# Patient Record
Sex: Male | Born: 1965 | Race: Black or African American | Hispanic: No | Marital: Single
Health system: Southern US, Community
[De-identification: ages and names within clinical notes are randomized; demographics above are authoritative.]

---

## 2001-08-26 ENCOUNTER — Encounter: Payer: Self-pay | Admitting: Internal Medicine

## 2001-08-26 ENCOUNTER — Ambulatory Visit (HOSPITAL_COMMUNITY): Admission: RE | Admit: 2001-08-26 | Discharge: 2001-08-26 | Payer: Self-pay | Admitting: Internal Medicine

## 2001-09-06 ENCOUNTER — Ambulatory Visit (HOSPITAL_COMMUNITY): Admission: RE | Admit: 2001-09-06 | Discharge: 2001-09-06 | Payer: Self-pay | Admitting: Internal Medicine

## 2001-09-06 ENCOUNTER — Encounter: Payer: Self-pay | Admitting: Internal Medicine

## 2004-03-10 ENCOUNTER — Emergency Department: Payer: Self-pay | Admitting: Emergency Medicine

## 2004-03-10 ENCOUNTER — Other Ambulatory Visit: Payer: Self-pay

## 2005-02-10 ENCOUNTER — Other Ambulatory Visit: Payer: Self-pay

## 2005-02-10 ENCOUNTER — Emergency Department: Payer: Self-pay | Admitting: Emergency Medicine

## 2005-04-03 ENCOUNTER — Emergency Department: Payer: Self-pay | Admitting: Emergency Medicine

## 2006-04-22 ENCOUNTER — Emergency Department: Payer: Self-pay | Admitting: General Practice

## 2006-06-09 ENCOUNTER — Emergency Department: Payer: Self-pay | Admitting: Emergency Medicine

## 2006-07-03 ENCOUNTER — Emergency Department: Payer: Self-pay | Admitting: Emergency Medicine

## 2007-07-22 ENCOUNTER — Emergency Department: Payer: Self-pay | Admitting: Emergency Medicine

## 2007-07-22 ENCOUNTER — Other Ambulatory Visit: Payer: Self-pay

## 2007-12-05 ENCOUNTER — Emergency Department: Payer: Self-pay | Admitting: Emergency Medicine

## 2008-08-02 ENCOUNTER — Emergency Department: Payer: Self-pay | Admitting: Emergency Medicine

## 2009-02-06 ENCOUNTER — Emergency Department: Payer: Self-pay | Admitting: Emergency Medicine

## 2009-02-08 ENCOUNTER — Emergency Department: Payer: Self-pay | Admitting: Emergency Medicine

## 2009-02-27 ENCOUNTER — Emergency Department: Payer: Self-pay | Admitting: Emergency Medicine

## 2009-05-06 ENCOUNTER — Emergency Department: Payer: Self-pay | Admitting: Emergency Medicine

## 2009-07-31 ENCOUNTER — Emergency Department: Payer: Self-pay | Admitting: Emergency Medicine

## 2009-08-20 ENCOUNTER — Emergency Department: Payer: Self-pay | Admitting: Emergency Medicine

## 2009-08-23 ENCOUNTER — Emergency Department: Payer: Self-pay | Admitting: Emergency Medicine

## 2009-11-01 ENCOUNTER — Emergency Department: Payer: Self-pay | Admitting: Emergency Medicine

## 2010-05-04 ENCOUNTER — Emergency Department: Payer: Self-pay | Admitting: Emergency Medicine

## 2010-06-06 ENCOUNTER — Emergency Department: Payer: Self-pay | Admitting: Emergency Medicine

## 2010-07-13 ENCOUNTER — Emergency Department: Payer: Self-pay | Admitting: Emergency Medicine

## 2011-01-15 ENCOUNTER — Emergency Department: Payer: Self-pay | Admitting: Emergency Medicine

## 2011-02-07 ENCOUNTER — Emergency Department: Payer: Self-pay | Admitting: Emergency Medicine

## 2011-02-28 ENCOUNTER — Emergency Department: Payer: Self-pay | Admitting: Emergency Medicine

## 2011-04-25 ENCOUNTER — Emergency Department: Payer: Self-pay | Admitting: Emergency Medicine

## 2011-04-25 LAB — URINALYSIS, COMPLETE
Bacteria: NONE SEEN
Blood: NEGATIVE
Glucose,UR: NEGATIVE mg/dL (ref 0–75)
Ketone: NEGATIVE
Nitrite: NEGATIVE
Specific Gravity: 1.024 (ref 1.003–1.030)
Squamous Epithelial: NONE SEEN
WBC UR: <1 /HPF (ref 0–5)

## 2011-04-25 LAB — CBC
HCT: 42.1 % (ref 40.0–52.0)
HGB: 14.3 g/dL (ref 13.0–18.0)
MCH: 30.5 pg (ref 26.0–34.0)
MCHC: 34 g/dL (ref 32.0–36.0)
MCV: 90 fL (ref 80–100)
Platelet: 231 10*3/uL (ref 150–440)
RBC: 4.7 10*6/uL (ref 4.40–5.90)
RDW: 13.2 % (ref 11.5–14.5)

## 2011-04-25 LAB — COMPREHENSIVE METABOLIC PANEL
Albumin: 4.2 g/dL (ref 3.4–5.0)
BUN: 16 mg/dL (ref 7–18)
Bilirubin,Total: 0.3 mg/dL (ref 0.2–1.0)
Chloride: 108 mmol/L — ABNORMAL HIGH (ref 98–107)
EGFR (African American): >60
EGFR (Non-African Amer.): >60
Glucose: 115 mg/dL — ABNORMAL HIGH (ref 65–99)
Osmolality: 289 (ref 275–301)
Potassium: 3.9 mmol/L (ref 3.5–5.1)
SGOT(AST): 29 U/L (ref 15–37)
Sodium: 144 mmol/L (ref 136–145)

## 2011-04-25 LAB — LIPASE, BLOOD: Lipase: 243 U/L (ref 73–393)

## 2011-05-07 ENCOUNTER — Ambulatory Visit: Payer: Self-pay | Admitting: Physician Assistant

## 2011-05-12 ENCOUNTER — Emergency Department: Payer: Self-pay | Admitting: Emergency Medicine

## 2011-05-12 LAB — URINALYSIS, COMPLETE
Bacteria: NONE SEEN
Bilirubin,UR: NEGATIVE
Blood: NEGATIVE
Glucose,UR: NEGATIVE mg/dL (ref 0–75)
Ketone: NEGATIVE
Leukocyte Esterase: NEGATIVE
Nitrite: NEGATIVE
Ph: 6 (ref 4.5–8.0)
RBC,UR: <1 /HPF (ref 0–5)
Squamous Epithelial: <1

## 2011-05-12 LAB — CBC
MCHC: 34.6 g/dL (ref 32.0–36.0)
Platelet: 215 10*3/uL (ref 150–440)
RDW: 13 % (ref 11.5–14.5)
WBC: 6.8 10*3/uL (ref 3.8–10.6)

## 2011-05-12 LAB — COMPREHENSIVE METABOLIC PANEL
Bilirubin,Total: 0.4 mg/dL (ref 0.2–1.0)
Calcium, Total: 9.3 mg/dL (ref 8.5–10.1)
Chloride: 104 mmol/L (ref 98–107)
Co2: 31 mmol/L (ref 21–32)
Creatinine: 1.18 mg/dL (ref 0.60–1.30)
EGFR (African American): >60
EGFR (Non-African Amer.): >60
Osmolality: 285 (ref 275–301)
SGOT(AST): 36 U/L (ref 15–37)
SGPT (ALT): 46 U/L

## 2011-05-21 ENCOUNTER — Emergency Department: Payer: Self-pay | Admitting: *Deleted

## 2011-05-22 ENCOUNTER — Other Ambulatory Visit: Payer: Self-pay | Admitting: Gastroenterology

## 2011-05-26 ENCOUNTER — Other Ambulatory Visit: Payer: Self-pay

## 2011-06-29 ENCOUNTER — Emergency Department: Payer: Self-pay | Admitting: Emergency Medicine

## 2011-06-29 LAB — CBC
HCT: 31.5 % — ABNORMAL LOW (ref 40.0–52.0)
HGB: 10.5 g/dL — ABNORMAL LOW (ref 13.0–18.0)
MCH: 29.7 pg (ref 26.0–34.0)
MCHC: 33.5 g/dL (ref 32.0–36.0)
MCV: 89 fL (ref 80–100)
Platelet: 561 10*3/uL — ABNORMAL HIGH (ref 150–440)
RBC: 3.55 10*6/uL — ABNORMAL LOW (ref 4.40–5.90)
RDW: 13.7 % (ref 11.5–14.5)
WBC: 10.3 10*3/uL (ref 3.8–10.6)

## 2011-06-29 LAB — COMPREHENSIVE METABOLIC PANEL
Albumin: 3.1 g/dL — ABNORMAL LOW (ref 3.4–5.0)
BUN: 17 mg/dL (ref 7–18)
Bilirubin,Total: 0.6 mg/dL (ref 0.2–1.0)
Chloride: 97 mmol/L — ABNORMAL LOW (ref 98–107)
Co2: 28 mmol/L (ref 21–32)
Creatinine: 0.97 mg/dL (ref 0.60–1.30)
EGFR (African American): >60
EGFR (Non-African Amer.): >60
Osmolality: 275 (ref 275–301)
Potassium: 3.6 mmol/L (ref 3.5–5.1)
SGPT (ALT): 43 U/L
Sodium: 136 mmol/L (ref 136–145)
Total Protein: 8.1 g/dL (ref 6.4–8.2)

## 2011-06-29 LAB — TSH: Thyroid Stimulating Horm: 1.65 u[IU]/mL

## 2011-06-30 LAB — HEMATOCRIT: HCT: 27.9 % — ABNORMAL LOW (ref 40.0–52.0)

## 2011-06-30 LAB — PROTIME-INR
INR: 1.1
Prothrombin Time: 14.7 secs (ref 11.5–14.7)

## 2011-06-30 LAB — APTT: Activated PTT: 27 secs (ref 23.6–35.9)

## 2011-07-05 LAB — CULTURE, BLOOD (SINGLE)

## 2011-07-28 ENCOUNTER — Emergency Department: Payer: Self-pay | Admitting: Emergency Medicine

## 2011-08-07 ENCOUNTER — Ambulatory Visit: Payer: Self-pay | Admitting: Internal Medicine

## 2011-08-07 LAB — CBC CANCER CENTER
Basophil %: 0.8 %
Eosinophil #: 0.2 x10 3/mm (ref 0.0–0.7)
Eosinophil %: 7 %
HGB: 11.2 g/dL — ABNORMAL LOW (ref 13.0–18.0)
Lymphocyte #: 0.7 x10 3/mm — ABNORMAL LOW (ref 1.0–3.6)
Lymphocyte %: 30.9 %
MCH: 28.1 pg (ref 26.0–34.0)
MCHC: 31.2 g/dL — ABNORMAL LOW (ref 32.0–36.0)
Monocyte #: 0.3 x10 3/mm (ref 0.2–1.0)
Monocyte %: 13.2 %
Neutrophil #: 1.2 x10 3/mm — ABNORMAL LOW (ref 1.4–6.5)
Platelet: 319 x10 3/mm (ref 150–440)
RBC: 3.98 10*6/uL — ABNORMAL LOW (ref 4.40–5.90)
RDW: 16.7 % — ABNORMAL HIGH (ref 11.5–14.5)

## 2011-08-07 LAB — COMPREHENSIVE METABOLIC PANEL
Albumin: 3.6 g/dL (ref 3.4–5.0)
Anion Gap: 6 — ABNORMAL LOW (ref 7–16)
BUN: 7 mg/dL (ref 7–18)
Calcium, Total: 9.3 mg/dL (ref 8.5–10.1)
Creatinine: 0.6 mg/dL (ref 0.60–1.30)
Glucose: 79 mg/dL (ref 65–99)
SGOT(AST): 33 U/L (ref 15–37)
SGPT (ALT): 32 U/L
Sodium: 144 mmol/L (ref 136–145)
Total Protein: 7.4 g/dL (ref 6.4–8.2)

## 2011-08-09 LAB — CANCER ANTIGEN 19-9: CA 19-9: 68 U/mL — ABNORMAL HIGH (ref 0–35)

## 2011-08-11 LAB — CREATININE, SERUM
Creatinine: 0.71 mg/dL (ref 0.60–1.30)
EGFR (African American): >60

## 2011-08-11 LAB — CBC CANCER CENTER
Basophil #: 0 x10 3/mm (ref 0.0–0.1)
Basophil %: 0.9 %
Eosinophil #: 0.1 x10 3/mm (ref 0.0–0.7)
Lymphocyte #: 0.8 x10 3/mm — ABNORMAL LOW (ref 1.0–3.6)
MCHC: 31.8 g/dL — ABNORMAL LOW (ref 32.0–36.0)
Monocyte #: 0.3 x10 3/mm (ref 0.2–1.0)
Monocyte %: 11.1 %
Neutrophil #: 1.8 x10 3/mm (ref 1.4–6.5)
Neutrophil %: 59.8 %
Platelet: 312 x10 3/mm (ref 150–440)
RDW: 16.3 % — ABNORMAL HIGH (ref 11.5–14.5)
WBC: 3 x10 3/mm — ABNORMAL LOW (ref 3.8–10.6)

## 2011-08-11 LAB — PROTIME-INR
INR: 1
Prothrombin Time: 13.9 secs (ref 11.5–14.7)

## 2011-08-11 LAB — LACTATE DEHYDROGENASE: LDH: 144 U/L (ref 87–241)

## 2011-08-11 LAB — RETICULOCYTES: Absolute Retic Count: 0.0335 10*6/uL (ref 0.024–0.084)

## 2011-08-11 LAB — IRON AND TIBC
Iron Bind.Cap.(Total): 257 ug/dL (ref 250–450)
Unbound Iron-Bind.Cap.: 228 ug/dL

## 2011-08-11 LAB — FERRITIN: Ferritin (ARMC): 233 ng/mL (ref 8–388)

## 2011-08-12 LAB — CBC CANCER CENTER
Basophil %: 1.3 %
Eosinophil #: 0.1 x10 3/mm (ref 0.0–0.7)
HCT: 35.7 % — ABNORMAL LOW (ref 40.0–52.0)
HGB: 11.4 g/dL — ABNORMAL LOW (ref 13.0–18.0)
Lymphocyte #: 0.8 x10 3/mm — ABNORMAL LOW (ref 1.0–3.6)
MCHC: 31.9 g/dL — ABNORMAL LOW (ref 32.0–36.0)
Neutrophil #: 1.2 x10 3/mm — ABNORMAL LOW (ref 1.4–6.5)
Neutrophil %: 49.6 %
RBC: 4 10*6/uL — ABNORMAL LOW (ref 4.40–5.90)
RDW: 16.3 % — ABNORMAL HIGH (ref 11.5–14.5)
WBC: 2.5 x10 3/mm — ABNORMAL LOW (ref 3.8–10.6)

## 2011-08-12 LAB — CREATININE, SERUM
EGFR (African American): >60
EGFR (Non-African Amer.): >60

## 2011-08-12 LAB — PROT IMMUNOELECTROPHORES(ARMC)

## 2011-08-13 ENCOUNTER — Emergency Department: Payer: Self-pay | Admitting: Emergency Medicine

## 2011-08-13 LAB — COMPREHENSIVE METABOLIC PANEL
Albumin: 3.6 g/dL (ref 3.4–5.0)
Alkaline Phosphatase: 89 U/L (ref 50–136)
Anion Gap: 8 (ref 7–16)
BUN: 8 mg/dL (ref 7–18)
Bilirubin,Total: 0.2 mg/dL (ref 0.2–1.0)
Calcium, Total: 9 mg/dL (ref 8.5–10.1)
Chloride: 109 mmol/L — ABNORMAL HIGH (ref 98–107)
Co2: 27 mmol/L (ref 21–32)
EGFR (African American): >60
EGFR (Non-African Amer.): >60
Osmolality: 284 (ref 275–301)
Potassium: 3.5 mmol/L (ref 3.5–5.1)
SGOT(AST): 23 U/L (ref 15–37)
SGPT (ALT): 27 U/L
Sodium: 144 mmol/L (ref 136–145)

## 2011-08-13 LAB — CBC
HCT: 34.7 % — ABNORMAL LOW (ref 40.0–52.0)
MCV: 90 fL (ref 80–100)
Platelet: 298 10*3/uL (ref 150–440)
RBC: 3.84 10*6/uL — ABNORMAL LOW (ref 4.40–5.90)
RDW: 16.4 % — ABNORMAL HIGH (ref 11.5–14.5)
WBC: 2.9 10*3/uL — ABNORMAL LOW (ref 3.8–10.6)

## 2011-08-13 LAB — TROPONIN I: Troponin-I: <0.02 ng/mL

## 2011-08-14 LAB — URINALYSIS, COMPLETE
Bilirubin,UR: NEGATIVE
Blood: NEGATIVE
Glucose,UR: NEGATIVE mg/dL (ref 0–75)
Nitrite: NEGATIVE
Ph: 6 (ref 4.5–8.0)
RBC,UR: NONE SEEN /HPF (ref 0–5)
Squamous Epithelial: NONE SEEN

## 2011-08-14 LAB — CBC CANCER CENTER
Bands: 1 %
Basophil #: 0 x10 3/mm (ref 0.0–0.1)
Basophil %: 1 %
Eosinophil %: 4.1 %
Eosinophil: 4 %
HCT: 36.5 % — ABNORMAL LOW (ref 40.0–52.0)
Lymphocytes: 25 %
MCH: 28.1 pg (ref 26.0–34.0)
MCHC: 31.1 g/dL — ABNORMAL LOW (ref 32.0–36.0)
MCV: 90 fL (ref 80–100)
Monocyte #: 0.4 x10 3/mm (ref 0.2–1.0)
Monocyte %: 12.6 %
Neutrophil #: 1.7 x10 3/mm (ref 1.4–6.5)
RBC: 4.04 10*6/uL — ABNORMAL LOW (ref 4.40–5.90)
RDW: 16.4 % — ABNORMAL HIGH (ref 11.5–14.5)
Variant Lymphocyte: 1 %

## 2011-08-14 LAB — RETICULOCYTES: Reticulocyte: 0.67 % (ref 0.5–1.5)

## 2011-08-19 ENCOUNTER — Ambulatory Visit: Payer: Self-pay | Admitting: Vascular Surgery

## 2011-08-19 ENCOUNTER — Ambulatory Visit: Payer: Self-pay | Admitting: Internal Medicine

## 2011-08-20 ENCOUNTER — Ambulatory Visit: Payer: Self-pay | Admitting: Internal Medicine

## 2011-08-26 LAB — CREATININE, SERUM
EGFR (African American): >60
EGFR (Non-African Amer.): >60

## 2011-08-26 LAB — CBC CANCER CENTER
Basophil %: 1.1 %
HCT: 35.7 % — ABNORMAL LOW (ref 40.0–52.0)
HGB: 11.1 g/dL — ABNORMAL LOW (ref 13.0–18.0)
Lymphocyte %: 40.8 %
Monocyte #: 0.4 x10 3/mm (ref 0.2–1.0)
Monocyte %: 16.4 %
Neutrophil #: 1 x10 3/mm — ABNORMAL LOW (ref 1.4–6.5)
Platelet: 285 x10 3/mm (ref 150–440)
RDW: 16.1 % — ABNORMAL HIGH (ref 11.5–14.5)
WBC: 2.6 x10 3/mm — ABNORMAL LOW (ref 3.8–10.6)

## 2011-09-02 LAB — BASIC METABOLIC PANEL
Calcium, Total: 8.5 mg/dL (ref 8.5–10.1)
EGFR (African American): >60
Glucose: 121 mg/dL — ABNORMAL HIGH (ref 65–99)
Osmolality: 283 (ref 275–301)
Sodium: 142 mmol/L (ref 136–145)

## 2011-09-02 LAB — CBC CANCER CENTER
Basophil %: 1 %
Eosinophil #: 0 x10 3/mm (ref 0.0–0.7)
HCT: 33.3 % — ABNORMAL LOW (ref 40.0–52.0)
HGB: 10.6 g/dL — ABNORMAL LOW (ref 13.0–18.0)
Lymphocyte #: 0.9 x10 3/mm — ABNORMAL LOW (ref 1.0–3.6)
Lymphocyte %: 62 %
MCH: 28.3 pg (ref 26.0–34.0)
MCHC: 31.8 g/dL — ABNORMAL LOW (ref 32.0–36.0)
MCV: 89 fL (ref 80–100)
Neutrophil #: 0.3 x10 3/mm — ABNORMAL LOW (ref 1.4–6.5)
Platelet: 224 x10 3/mm (ref 150–440)
RBC: 3.75 10*6/uL — ABNORMAL LOW (ref 4.40–5.90)

## 2011-09-09 LAB — CBC CANCER CENTER
Eosinophil #: 0.1 x10 3/mm (ref 0.0–0.7)
HCT: 35 % — ABNORMAL LOW (ref 40.0–52.0)
HGB: 11 g/dL — ABNORMAL LOW (ref 13.0–18.0)
Lymphocyte #: 1 x10 3/mm (ref 1.0–3.6)
Lymphocyte %: 39.5 %
MCHC: 31.5 g/dL — ABNORMAL LOW (ref 32.0–36.0)
MCV: 90 fL (ref 80–100)
Monocyte #: 0.4 x10 3/mm (ref 0.2–1.0)
Monocyte %: 14.8 %
Neutrophil #: 1.1 x10 3/mm — ABNORMAL LOW (ref 1.4–6.5)
Neutrophil %: 42 %
RDW: 16.5 % — ABNORMAL HIGH (ref 11.5–14.5)
WBC: 2.6 x10 3/mm — ABNORMAL LOW (ref 3.8–10.6)

## 2011-09-09 LAB — BASIC METABOLIC PANEL
Anion Gap: 8 (ref 7–16)
BUN: 9 mg/dL (ref 7–18)
Creatinine: 0.73 mg/dL (ref 0.60–1.30)
EGFR (Non-African Amer.): >60
Glucose: 86 mg/dL (ref 65–99)
Sodium: 143 mmol/L (ref 136–145)

## 2011-09-13 ENCOUNTER — Emergency Department: Payer: Self-pay | Admitting: *Deleted

## 2011-09-16 LAB — CBC CANCER CENTER
Basophil #: 0 x10 3/mm (ref 0.0–0.1)
Eosinophil #: 0 x10 3/mm (ref 0.0–0.7)
HGB: 11 g/dL — ABNORMAL LOW (ref 13.0–18.0)
Lymphocyte #: 1.6 x10 3/mm (ref 1.0–3.6)
Lymphocyte %: 20.9 %
MCH: 28.7 pg (ref 26.0–34.0)
MCHC: 31.5 g/dL — ABNORMAL LOW (ref 32.0–36.0)
Monocyte #: 1.1 x10 3/mm — ABNORMAL HIGH (ref 0.2–1.0)
Monocyte %: 13.3 %
Neutrophil #: 5.1 x10 3/mm (ref 1.4–6.5)
Platelet: 240 x10 3/mm (ref 150–440)
RBC: 3.83 10*6/uL — ABNORMAL LOW (ref 4.40–5.90)
RDW: 16.5 % — ABNORMAL HIGH (ref 11.5–14.5)
WBC: 7.9 x10 3/mm (ref 3.8–10.6)

## 2011-09-16 LAB — BASIC METABOLIC PANEL
BUN: 11 mg/dL (ref 7–18)
Chloride: 108 mmol/L — ABNORMAL HIGH (ref 98–107)
Co2: 29 mmol/L (ref 21–32)
Creatinine: 0.81 mg/dL (ref 0.60–1.30)
EGFR (Non-African Amer.): >60
Glucose: 97 mg/dL (ref 65–99)
Osmolality: 290 (ref 275–301)
Potassium: 3 mmol/L — ABNORMAL LOW (ref 3.5–5.1)
Sodium: 146 mmol/L — ABNORMAL HIGH (ref 136–145)

## 2011-09-19 ENCOUNTER — Ambulatory Visit: Payer: Self-pay | Admitting: Internal Medicine

## 2011-09-30 LAB — BASIC METABOLIC PANEL
Anion Gap: 7 (ref 7–16)
Chloride: 108 mmol/L — ABNORMAL HIGH (ref 98–107)
Co2: 29 mmol/L (ref 21–32)
Creatinine: 0.75 mg/dL (ref 0.60–1.30)
Glucose: 102 mg/dL — ABNORMAL HIGH (ref 65–99)
Osmolality: 286 (ref 275–301)
Potassium: 3.6 mmol/L (ref 3.5–5.1)
Sodium: 144 mmol/L (ref 136–145)

## 2011-09-30 LAB — CBC CANCER CENTER
Basophil #: 0 x10 3/mm (ref 0.0–0.1)
Basophil %: 0.9 %
Eosinophil #: 0 x10 3/mm (ref 0.0–0.7)
HGB: 11 g/dL — ABNORMAL LOW (ref 13.0–18.0)
Lymphocyte #: 1 x10 3/mm (ref 1.0–3.6)
MCH: 29 pg (ref 26.0–34.0)
Monocyte %: 18.8 %
Neutrophil #: 0.5 x10 3/mm — ABNORMAL LOW (ref 1.4–6.5)
Neutrophil %: 27.7 %
Platelet: 413 x10 3/mm (ref 150–440)

## 2011-09-30 LAB — HEPATIC FUNCTION PANEL A (ARMC)
Alkaline Phosphatase: 107 U/L (ref 50–136)
Bilirubin,Total: 0.2 mg/dL (ref 0.2–1.0)
SGOT(AST): 24 U/L (ref 15–37)

## 2011-10-07 LAB — CBC CANCER CENTER
Eosinophil %: 2.5 %
HCT: 36.8 % — ABNORMAL LOW (ref 40.0–52.0)
HGB: 11.7 g/dL — ABNORMAL LOW (ref 13.0–18.0)
Lymphocyte %: 25.4 %
MCH: 29.5 pg (ref 26.0–34.0)
MCHC: 31.9 g/dL — ABNORMAL LOW (ref 32.0–36.0)
MCV: 92 fL (ref 80–100)
Neutrophil %: 51.9 %
Platelet: 394 x10 3/mm (ref 150–440)
RBC: 3.99 10*6/uL — ABNORMAL LOW (ref 4.40–5.90)
RDW: 18 % — ABNORMAL HIGH (ref 11.5–14.5)

## 2011-10-14 LAB — BASIC METABOLIC PANEL
BUN: 15 mg/dL (ref 7–18)
Calcium, Total: 9.1 mg/dL (ref 8.5–10.1)
Creatinine: 0.73 mg/dL (ref 0.60–1.30)
Glucose: 76 mg/dL (ref 65–99)
Osmolality: 285 (ref 275–301)

## 2011-10-14 LAB — CBC CANCER CENTER
Basophil #: 0.1 x10 3/mm (ref 0.0–0.1)
Basophil %: 0.6 %
Eosinophil #: 0.1 x10 3/mm (ref 0.0–0.7)
Eosinophil %: 0.7 %
HGB: 11.9 g/dL — ABNORMAL LOW (ref 13.0–18.0)
Lymphocyte #: 1.8 x10 3/mm (ref 1.0–3.6)
Lymphocyte %: 17.1 %
MCH: 29.4 pg (ref 26.0–34.0)
MCHC: 32 g/dL (ref 32.0–36.0)
Monocyte #: 0.6 x10 3/mm (ref 0.2–1.0)
Monocyte %: 5.9 %
Neutrophil #: 7.8 x10 3/mm — ABNORMAL HIGH (ref 1.4–6.5)
Neutrophil %: 75.7 %
RBC: 4.04 10*6/uL — ABNORMAL LOW (ref 4.40–5.90)

## 2011-10-19 ENCOUNTER — Ambulatory Visit: Payer: Self-pay | Admitting: Internal Medicine

## 2011-10-21 LAB — CBC CANCER CENTER
Basophil #: 0.1 x10 3/mm (ref 0.0–0.1)
Basophil %: 1.3 %
Eosinophil #: 0 x10 3/mm (ref 0.0–0.7)
Eosinophil %: 0.8 %
HCT: 34.2 % — ABNORMAL LOW (ref 40.0–52.0)
HGB: 11 g/dL — ABNORMAL LOW (ref 13.0–18.0)
Lymphocyte #: 1.3 x10 3/mm (ref 1.0–3.6)
Lymphocyte %: 32.7 %
MCH: 29.6 pg (ref 26.0–34.0)
MCHC: 32.2 g/dL (ref 32.0–36.0)
MCV: 92 fL (ref 80–100)
Monocyte #: 0.4 x10 3/mm (ref 0.2–1.0)
Monocyte %: 10.9 %
Neutrophil #: 2.2 x10 3/mm (ref 1.4–6.5)
Neutrophil %: 54.3 %
Platelet: 148 x10 3/mm — ABNORMAL LOW (ref 150–440)
RBC: 3.71 10*6/uL — ABNORMAL LOW (ref 4.40–5.90)
RDW: 17.4 % — ABNORMAL HIGH (ref 11.5–14.5)
WBC: 4 x10 3/mm (ref 3.8–10.6)

## 2011-10-28 LAB — CBC CANCER CENTER
Basophil #: 0 x10 3/mm (ref 0.0–0.1)
Basophil %: 0.4 %
Eosinophil #: 0.1 x10 3/mm (ref 0.0–0.7)
Eosinophil %: 0.6 %
HCT: 35.4 % — ABNORMAL LOW (ref 40.0–52.0)
HGB: 11.2 g/dL — ABNORMAL LOW (ref 13.0–18.0)
Lymphocyte #: 1.7 x10 3/mm (ref 1.0–3.6)
Lymphocyte %: 18.1 %
MCH: 29.7 pg (ref 26.0–34.0)
MCHC: 31.8 g/dL — ABNORMAL LOW (ref 32.0–36.0)
MCV: 93 fL (ref 80–100)
Monocyte #: 0.8 x10 3/mm (ref 0.2–1.0)
Monocyte %: 8.8 %
Neutrophil #: 6.9 x10 3/mm — ABNORMAL HIGH (ref 1.4–6.5)
Neutrophil %: 72.1 %
Platelet: 191 x10 3/mm (ref 150–440)
RBC: 3.79 10*6/uL — ABNORMAL LOW (ref 4.40–5.90)
RDW: 18.2 % — ABNORMAL HIGH (ref 11.5–14.5)
WBC: 9.5 x10 3/mm (ref 3.8–10.6)

## 2011-10-28 LAB — CREATININE, SERUM
Creatinine: 0.91 mg/dL (ref 0.60–1.30)
EGFR (African American): >60
EGFR (Non-African Amer.): >60

## 2011-10-28 LAB — HEPATIC FUNCTION PANEL A (ARMC)
Albumin: 3.6 g/dL (ref 3.4–5.0)
Alkaline Phosphatase: 140 U/L — ABNORMAL HIGH (ref 50–136)
Bilirubin, Direct: 0 mg/dL (ref 0.00–0.20)
Bilirubin,Total: 0.1 mg/dL — ABNORMAL LOW (ref 0.2–1.0)
SGOT(AST): 24 U/L (ref 15–37)
SGPT (ALT): 35 U/L
Total Protein: 7.5 g/dL (ref 6.4–8.2)

## 2011-10-29 LAB — CANCER ANTIGEN 19-9: CA 19-9: 208 U/mL — ABNORMAL HIGH (ref 0–35)

## 2011-11-03 ENCOUNTER — Ambulatory Visit: Payer: Self-pay | Admitting: Internal Medicine

## 2011-11-04 LAB — CBC CANCER CENTER
Basophil #: 0 x10 3/mm (ref 0.0–0.1)
Eosinophil #: 0.1 x10 3/mm (ref 0.0–0.7)
Eosinophil %: 2.5 %
HCT: 35.2 % — ABNORMAL LOW (ref 40.0–52.0)
HGB: 11.4 g/dL — ABNORMAL LOW (ref 13.0–18.0)
MCH: 30.2 pg (ref 26.0–34.0)
MCV: 94 fL (ref 80–100)
Monocyte #: 0.4 x10 3/mm (ref 0.2–1.0)
Neutrophil #: 0.9 x10 3/mm — ABNORMAL LOW (ref 1.4–6.5)
Neutrophil %: 39.6 %
Platelet: 338 x10 3/mm (ref 150–440)
RBC: 3.77 10*6/uL — ABNORMAL LOW (ref 4.40–5.90)
RDW: 19.3 % — ABNORMAL HIGH (ref 11.5–14.5)

## 2011-11-18 LAB — CBC CANCER CENTER
Eosinophil #: 0.1 x10 3/mm (ref 0.0–0.7)
Eosinophil %: 3.1 %
HGB: 12.5 g/dL — ABNORMAL LOW (ref 13.0–18.0)
Lymphocyte #: 0.7 x10 3/mm — ABNORMAL LOW (ref 1.0–3.6)
MCH: 31.5 pg (ref 26.0–34.0)
MCHC: 33.3 g/dL (ref 32.0–36.0)
MCV: 95 fL (ref 80–100)
Monocyte #: 0.4 x10 3/mm (ref 0.2–1.0)
Neutrophil #: 1 x10 3/mm — ABNORMAL LOW (ref 1.4–6.5)
Platelet: 274 x10 3/mm (ref 150–440)
RBC: 3.98 10*6/uL — ABNORMAL LOW (ref 4.40–5.90)

## 2011-11-18 LAB — BASIC METABOLIC PANEL
Anion Gap: 7 (ref 7–16)
BUN: 9 mg/dL (ref 7–18)
Calcium, Total: 9 mg/dL (ref 8.5–10.1)
Creatinine: 0.84 mg/dL (ref 0.60–1.30)
EGFR (African American): >60
EGFR (Non-African Amer.): >60
Glucose: 82 mg/dL (ref 65–99)
Potassium: 3.7 mmol/L (ref 3.5–5.1)
Sodium: 143 mmol/L (ref 136–145)

## 2011-11-19 ENCOUNTER — Ambulatory Visit: Payer: Self-pay | Admitting: Internal Medicine

## 2011-11-23 LAB — CBC CANCER CENTER
Basophil #: 0.1 x10 3/mm (ref 0.0–0.1)
Eosinophil #: 0.1 x10 3/mm (ref 0.0–0.7)
HCT: 39.1 % — ABNORMAL LOW (ref 40.0–52.0)
Lymphocyte %: 20.9 %
MCHC: 33.8 g/dL (ref 32.0–36.0)
Monocyte #: 0.5 x10 3/mm (ref 0.2–1.0)
Monocyte %: 16.1 %
Neutrophil #: 1.6 x10 3/mm (ref 1.4–6.5)
Neutrophil %: 56.8 %
Platelet: 227 x10 3/mm (ref 150–440)
RDW: 17.2 % — ABNORMAL HIGH (ref 11.5–14.5)
WBC: 2.8 x10 3/mm — ABNORMAL LOW (ref 3.8–10.6)

## 2011-11-25 LAB — BASIC METABOLIC PANEL
Anion Gap: 8 (ref 7–16)
Chloride: 104 mmol/L (ref 98–107)
EGFR (African American): >60
EGFR (Non-African Amer.): >60
Glucose: 81 mg/dL (ref 65–99)
Osmolality: 280 (ref 275–301)

## 2011-11-30 LAB — CBC CANCER CENTER
Basophil #: 0.1 x10 3/mm (ref 0.0–0.1)
Eosinophil #: 0.4 x10 3/mm (ref 0.0–0.7)
HCT: 39.1 % — ABNORMAL LOW (ref 40.0–52.0)
Lymphocyte #: 0.4 x10 3/mm — ABNORMAL LOW (ref 1.0–3.6)
MCHC: 34 g/dL (ref 32.0–36.0)
MCV: 95 fL (ref 80–100)
Monocyte #: 0.3 x10 3/mm (ref 0.2–1.0)
Neutrophil #: 1.6 x10 3/mm (ref 1.4–6.5)
Platelet: 204 x10 3/mm (ref 150–440)
RBC: 4.13 10*6/uL — ABNORMAL LOW (ref 4.40–5.90)
RDW: 16.8 % — ABNORMAL HIGH (ref 11.5–14.5)
WBC: 2.8 x10 3/mm — ABNORMAL LOW (ref 3.8–10.6)

## 2011-12-02 LAB — BASIC METABOLIC PANEL
Anion Gap: 4 — ABNORMAL LOW (ref 7–16)
BUN: 12 mg/dL (ref 7–18)
Calcium, Total: 9.1 mg/dL (ref 8.5–10.1)
EGFR (African American): >60
EGFR (Non-African Amer.): >60
Glucose: 82 mg/dL (ref 65–99)
Osmolality: 280 (ref 275–301)
Potassium: 4 mmol/L (ref 3.5–5.1)

## 2011-12-02 LAB — CBC CANCER CENTER
Basophil #: 0 x10 3/mm (ref 0.0–0.1)
Eosinophil %: 15.4 %
HCT: 38.9 % — ABNORMAL LOW (ref 40.0–52.0)
Lymphocyte %: 13.4 %
Monocyte #: 0.3 x10 3/mm (ref 0.2–1.0)
Monocyte %: 11.8 %
Neutrophil %: 57.8 %
Platelet: 165 x10 3/mm (ref 150–440)
RDW: 16.9 % — ABNORMAL HIGH (ref 11.5–14.5)
WBC: 2.3 x10 3/mm — ABNORMAL LOW (ref 3.8–10.6)

## 2011-12-07 LAB — CBC CANCER CENTER
Basophil #: 0 x10 3/mm (ref 0.0–0.1)
Eosinophil %: 26 %
Lymphocyte #: 0.3 x10 3/mm — ABNORMAL LOW (ref 1.0–3.6)
Lymphocyte %: 13.1 %
MCH: 31.4 pg (ref 26.0–34.0)
MCV: 95 fL (ref 80–100)
Monocyte %: 14.4 %
Neutrophil %: 44.3 %
Platelet: 182 x10 3/mm (ref 150–440)
RBC: 3.98 10*6/uL — ABNORMAL LOW (ref 4.40–5.90)
RDW: 16.2 % — ABNORMAL HIGH (ref 11.5–14.5)
WBC: 2.2 x10 3/mm — ABNORMAL LOW (ref 3.8–10.6)

## 2011-12-09 LAB — HEPATIC FUNCTION PANEL A (ARMC)
Albumin: 3.7 g/dL (ref 3.4–5.0)
Alkaline Phosphatase: 86 U/L (ref 50–136)
Bilirubin, Direct: 0.1 mg/dL (ref 0.00–0.20)
Bilirubin,Total: 0.5 mg/dL (ref 0.2–1.0)
SGPT (ALT): 24 U/L (ref 12–78)

## 2011-12-09 LAB — CBC CANCER CENTER
Basophil #: 0.1 x10 3/mm (ref 0.0–0.1)
Eosinophil #: 0.3 x10 3/mm (ref 0.0–0.7)
Eosinophil %: 13.5 %
HCT: 37.7 % — ABNORMAL LOW (ref 40.0–52.0)
HGB: 12.4 g/dL — ABNORMAL LOW (ref 13.0–18.0)
Lymphocyte %: 11.6 %
MCHC: 32.9 g/dL (ref 32.0–36.0)
MCV: 95 fL (ref 80–100)
Neutrophil #: 1.1 x10 3/mm — ABNORMAL LOW (ref 1.4–6.5)
Neutrophil %: 59.7 %
Platelet: 192 x10 3/mm (ref 150–440)
RBC: 3.96 10*6/uL — ABNORMAL LOW (ref 4.40–5.90)
RDW: 16.1 % — ABNORMAL HIGH (ref 11.5–14.5)
WBC: 1.9 x10 3/mm — CL (ref 3.8–10.6)

## 2011-12-09 LAB — BASIC METABOLIC PANEL
Anion Gap: 7 (ref 7–16)
BUN: 10 mg/dL (ref 7–18)
Co2: 31 mmol/L (ref 21–32)
Creatinine: 0.83 mg/dL (ref 0.60–1.30)
EGFR (African American): >60
EGFR (Non-African Amer.): >60
Glucose: 102 mg/dL — ABNORMAL HIGH (ref 65–99)
Potassium: 3.5 mmol/L (ref 3.5–5.1)

## 2011-12-09 LAB — PROTIME-INR
INR: 0.9
Prothrombin Time: 12.8 secs (ref 11.5–14.7)

## 2011-12-09 LAB — MAGNESIUM: Magnesium: 2 mg/dL

## 2011-12-09 LAB — APTT: Activated PTT: 32.7 secs (ref 23.6–35.9)

## 2011-12-14 LAB — PROTIME-INR: Prothrombin Time: 12.8 secs (ref 11.5–14.7)

## 2011-12-14 LAB — CBC CANCER CENTER
Basophil #: 0 x10 3/mm (ref 0.0–0.1)
Eosinophil #: 0.4 x10 3/mm (ref 0.0–0.7)
Eosinophil %: 18.5 %
Lymphocyte %: 9.2 %
MCHC: 33.3 g/dL (ref 32.0–36.0)
Neutrophil %: 60.2 %
RDW: 15.2 % — ABNORMAL HIGH (ref 11.5–14.5)
WBC: 2.2 x10 3/mm — ABNORMAL LOW (ref 3.8–10.6)

## 2011-12-16 LAB — BASIC METABOLIC PANEL
Anion Gap: 5 — ABNORMAL LOW (ref 7–16)
Calcium, Total: 9 mg/dL (ref 8.5–10.1)
Chloride: 105 mmol/L (ref 98–107)
Co2: 31 mmol/L (ref 21–32)
Creatinine: 0.84 mg/dL (ref 0.60–1.30)
EGFR (African American): >60
EGFR (Non-African Amer.): >60
Glucose: 90 mg/dL (ref 65–99)
Osmolality: 280 (ref 275–301)

## 2011-12-16 LAB — CBC CANCER CENTER
Basophil #: 0 x10 3/mm (ref 0.0–0.1)
Basophil %: 0.3 %
HCT: 37.3 % — ABNORMAL LOW (ref 40.0–52.0)
HGB: 12.4 g/dL — ABNORMAL LOW (ref 13.0–18.0)
Lymphocyte %: 7.2 %
Monocyte %: 15.2 %
Neutrophil #: 1.3 x10 3/mm — ABNORMAL LOW (ref 1.4–6.5)
RDW: 15.2 % — ABNORMAL HIGH (ref 11.5–14.5)
WBC: 2.1 x10 3/mm — ABNORMAL LOW (ref 3.8–10.6)

## 2011-12-16 LAB — PROTIME-INR: Prothrombin Time: 12.8 secs (ref 11.5–14.7)

## 2011-12-17 LAB — CBC CANCER CENTER
Basophil #: 0 x10 3/mm (ref 0.0–0.1)
Basophil %: 0.3 %
HCT: 34.7 % — ABNORMAL LOW (ref 40.0–52.0)
MCV: 95 fL (ref 80–100)
Monocyte %: 19.6 %
Neutrophil #: 1.7 x10 3/mm (ref 1.4–6.5)
Neutrophil %: 71.8 %
Platelet: 193 x10 3/mm (ref 150–440)
RBC: 3.64 10*6/uL — ABNORMAL LOW (ref 4.40–5.90)
RDW: 15.4 % — ABNORMAL HIGH (ref 11.5–14.5)
WBC: 2.3 x10 3/mm — ABNORMAL LOW (ref 3.8–10.6)

## 2011-12-17 LAB — BASIC METABOLIC PANEL
Anion Gap: 6 — ABNORMAL LOW (ref 7–16)
Calcium, Total: 9.3 mg/dL (ref 8.5–10.1)
Creatinine: 0.84 mg/dL (ref 0.60–1.30)
EGFR (Non-African Amer.): >60
Glucose: 98 mg/dL (ref 65–99)
Osmolality: 283 (ref 275–301)
Potassium: 3.8 mmol/L (ref 3.5–5.1)

## 2011-12-17 LAB — MAGNESIUM: Magnesium: 1.9 mg/dL

## 2011-12-18 LAB — URINALYSIS, COMPLETE
Bacteria: NONE SEEN
Glucose,UR: NEGATIVE mg/dL (ref 0–75)
Leukocyte Esterase: NEGATIVE
Nitrite: NEGATIVE
RBC,UR: <1 /HPF (ref 0–5)
Specific Gravity: 1.025 (ref 1.003–1.030)
Squamous Epithelial: NONE SEEN
WBC UR: 2 /HPF (ref 0–5)

## 2011-12-18 LAB — CBC
HCT: 34.6 % — ABNORMAL LOW (ref 40.0–52.0)
HGB: 12 g/dL — ABNORMAL LOW (ref 13.0–18.0)
MCH: 33 pg (ref 26.0–34.0)
MCHC: 34.8 g/dL (ref 32.0–36.0)
Platelet: 179 10*3/uL (ref 150–440)
RBC: 3.64 10*6/uL — ABNORMAL LOW (ref 4.40–5.90)

## 2011-12-18 LAB — COMPREHENSIVE METABOLIC PANEL
Albumin: 3.7 g/dL (ref 3.4–5.0)
Alkaline Phosphatase: 77 U/L (ref 50–136)
Anion Gap: 6 — ABNORMAL LOW (ref 7–16)
BUN: 10 mg/dL (ref 7–18)
Bilirubin,Total: 0.3 mg/dL (ref 0.2–1.0)
Calcium, Total: 9.1 mg/dL (ref 8.5–10.1)
Co2: 29 mmol/L (ref 21–32)
Creatinine: 0.86 mg/dL (ref 0.60–1.30)
EGFR (Non-African Amer.): >60
Osmolality: 276 (ref 275–301)
Potassium: 3.9 mmol/L (ref 3.5–5.1)
Sodium: 139 mmol/L (ref 136–145)
Total Protein: 7.4 g/dL (ref 6.4–8.2)

## 2011-12-19 ENCOUNTER — Inpatient Hospital Stay: Payer: Self-pay | Admitting: Internal Medicine

## 2011-12-19 LAB — URINALYSIS, COMPLETE
Bacteria: NONE SEEN
Bilirubin,UR: NEGATIVE
Blood: NEGATIVE
Glucose,UR: NEGATIVE mg/dL (ref 0–75)
Leukocyte Esterase: NEGATIVE
RBC,UR: <1 /HPF (ref 0–5)
Squamous Epithelial: NONE SEEN
WBC UR: 1 /HPF (ref 0–5)

## 2011-12-19 LAB — CK TOTAL AND CKMB (NOT AT ARMC): CK-MB: 0.5 ng/mL (ref 0.5–3.6)

## 2011-12-20 ENCOUNTER — Ambulatory Visit: Payer: Self-pay | Admitting: Internal Medicine

## 2011-12-21 ENCOUNTER — Inpatient Hospital Stay: Payer: Self-pay | Admitting: Internal Medicine

## 2011-12-21 LAB — CBC WITH DIFFERENTIAL/PLATELET
Basophil %: 0.7 %
Eosinophil %: 0.2 %
HCT: 33.2 % — ABNORMAL LOW (ref 40.0–52.0)
HGB: 11.7 g/dL — ABNORMAL LOW (ref 13.0–18.0)
Lymphocyte %: 5 %
Monocyte %: 13.7 %
Neutrophil #: 1.4 10*3/uL (ref 1.4–6.5)
RBC: 3.53 10*6/uL — ABNORMAL LOW (ref 4.40–5.90)
WBC: 1.8 10*3/uL — CL (ref 3.8–10.6)

## 2011-12-21 LAB — COMPREHENSIVE METABOLIC PANEL
Anion Gap: 6 — ABNORMAL LOW (ref 7–16)
BUN: 10 mg/dL (ref 7–18)
Bilirubin,Total: 0.2 mg/dL (ref 0.2–1.0)
Calcium, Total: 8.6 mg/dL (ref 8.5–10.1)
Chloride: 106 mmol/L (ref 98–107)
Co2: 27 mmol/L (ref 21–32)
EGFR (African American): >60
EGFR (Non-African Amer.): >60
Potassium: 3.4 mmol/L — ABNORMAL LOW (ref 3.5–5.1)
SGOT(AST): 27 U/L (ref 15–37)
Sodium: 139 mmol/L (ref 136–145)
Total Protein: 7.1 g/dL (ref 6.4–8.2)

## 2011-12-22 ENCOUNTER — Ambulatory Visit: Payer: Self-pay | Admitting: Internal Medicine

## 2011-12-22 LAB — COMPREHENSIVE METABOLIC PANEL
Albumin: 2.9 g/dL — ABNORMAL LOW (ref 3.4–5.0)
Anion Gap: 6 — ABNORMAL LOW (ref 7–16)
Bilirubin,Total: 0.3 mg/dL (ref 0.2–1.0)
Calcium, Total: 8.4 mg/dL — ABNORMAL LOW (ref 8.5–10.1)
Chloride: 106 mmol/L (ref 98–107)
Co2: 27 mmol/L (ref 21–32)
EGFR (African American): >60
EGFR (Non-African Amer.): >60
Osmolality: 273 (ref 275–301)
Potassium: 3.5 mmol/L (ref 3.5–5.1)
SGOT(AST): 19 U/L (ref 15–37)
Sodium: 139 mmol/L (ref 136–145)

## 2011-12-22 LAB — CBC WITH DIFFERENTIAL/PLATELET
Basophil #: 0 10*3/uL (ref 0.0–0.1)
Basophil %: 1.1 %
HCT: 30.4 % — ABNORMAL LOW (ref 40.0–52.0)
HGB: 10.1 g/dL — ABNORMAL LOW (ref 13.0–18.0)
Lymphocyte %: 7.3 %
MCH: 31.1 pg (ref 26.0–34.0)
MCHC: 33.3 g/dL (ref 32.0–36.0)
Monocyte %: 15.3 %
Neutrophil %: 75.9 %
Platelet: 143 10*3/uL — ABNORMAL LOW (ref 150–440)
RDW: 14.5 % (ref 11.5–14.5)

## 2011-12-22 LAB — VANCOMYCIN, TROUGH: Vancomycin, Trough: 16 ug/mL (ref 10–20)

## 2011-12-23 LAB — CBC CANCER CENTER
Basophil #: 0 x10 3/mm (ref 0.0–0.1)
Basophil %: 1.3 %
Eosinophil %: 0.7 %
HCT: 35.4 % — ABNORMAL LOW (ref 40.0–52.0)
HGB: 12.3 g/dL — ABNORMAL LOW (ref 13.0–18.0)
Lymphocyte #: 0.1 x10 3/mm — ABNORMAL LOW (ref 1.0–3.6)
Lymphocyte %: 12.2 %
MCH: 32.1 pg (ref 26.0–34.0)
MCHC: 34.8 g/dL (ref 32.0–36.0)
MCV: 92 fL (ref 80–100)
Monocyte %: 33.8 %
Neutrophil #: 0.4 x10 3/mm — ABNORMAL LOW (ref 1.4–6.5)
Platelet: 161 x10 3/mm (ref 150–440)
RBC: 3.84 10*6/uL — ABNORMAL LOW (ref 4.40–5.90)
WBC: 0.8 x10 3/mm — CL (ref 3.8–10.6)

## 2011-12-23 LAB — BASIC METABOLIC PANEL
Anion Gap: 4 — ABNORMAL LOW (ref 7–16)
BUN: 7 mg/dL (ref 7–18)
Chloride: 102 mmol/L (ref 98–107)
Co2: 31 mmol/L (ref 21–32)
Creatinine: 0.68 mg/dL (ref 0.60–1.30)
EGFR (African American): >60
EGFR (Non-African Amer.): >60
Glucose: 86 mg/dL (ref 65–99)
Sodium: 137 mmol/L (ref 136–145)

## 2011-12-23 LAB — HEPATIC FUNCTION PANEL A (ARMC)
Alkaline Phosphatase: 74 U/L (ref 50–136)
SGOT(AST): 18 U/L (ref 15–37)
SGPT (ALT): 21 U/L (ref 12–78)
Total Protein: 7.6 g/dL (ref 6.4–8.2)

## 2011-12-23 LAB — PROTIME-INR
INR: 1
Prothrombin Time: 13.3 secs (ref 11.5–14.7)

## 2011-12-24 LAB — CULTURE, BLOOD (SINGLE)

## 2011-12-26 LAB — CULTURE, BLOOD (SINGLE)

## 2011-12-28 LAB — CBC CANCER CENTER
Basophil %: 1.6 %
Eosinophil #: 0 x10 3/mm (ref 0.0–0.7)
HCT: 34.5 % — ABNORMAL LOW (ref 40.0–52.0)
HGB: 11.3 g/dL — ABNORMAL LOW (ref 13.0–18.0)
Lymphocyte #: 0.2 x10 3/mm — ABNORMAL LOW (ref 1.0–3.6)
MCH: 31.6 pg (ref 26.0–34.0)
MCHC: 32.9 g/dL (ref 32.0–36.0)
MCV: 96 fL (ref 80–100)
Monocyte #: 0.3 x10 3/mm (ref 0.2–1.0)
Neutrophil #: 0.9 x10 3/mm — ABNORMAL LOW (ref 1.4–6.5)
RBC: 3.59 10*6/uL — ABNORMAL LOW (ref 4.40–5.90)

## 2011-12-30 LAB — BASIC METABOLIC PANEL
Anion Gap: 6 — ABNORMAL LOW (ref 7–16)
Calcium, Total: 8.9 mg/dL (ref 8.5–10.1)
Chloride: 107 mmol/L (ref 98–107)
Co2: 31 mmol/L (ref 21–32)
EGFR (Non-African Amer.): >60
Osmolality: 287 (ref 275–301)
Potassium: 3.5 mmol/L (ref 3.5–5.1)

## 2011-12-30 LAB — HEPATIC FUNCTION PANEL A (ARMC)
Albumin: 3.5 g/dL (ref 3.4–5.0)
Alkaline Phosphatase: 73 U/L (ref 50–136)
Bilirubin, Direct: 0.1 mg/dL (ref 0.00–0.20)

## 2011-12-30 LAB — CBC CANCER CENTER
Basophil #: 0 x10 3/mm (ref 0.0–0.1)
Eosinophil %: 3.5 %
Lymphocyte %: 12.7 %
MCH: 31.2 pg (ref 26.0–34.0)
MCHC: 32.4 g/dL (ref 32.0–36.0)
Neutrophil %: 62.7 %
Platelet: 197 x10 3/mm (ref 150–440)
RBC: 3.53 10*6/uL — ABNORMAL LOW (ref 4.40–5.90)
WBC: 1.6 x10 3/mm — CL (ref 3.8–10.6)

## 2011-12-30 LAB — MAGNESIUM: Magnesium: 2.1 mg/dL

## 2012-01-12 ENCOUNTER — Emergency Department: Payer: Self-pay | Admitting: Emergency Medicine

## 2012-01-12 LAB — URINALYSIS, COMPLETE
Bacteria: NONE SEEN
Glucose,UR: NEGATIVE mg/dL (ref 0–75)
Leukocyte Esterase: NEGATIVE
Nitrite: NEGATIVE
Specific Gravity: 1.019 (ref 1.003–1.030)
Squamous Epithelial: <1
WBC UR: 1 /HPF (ref 0–5)

## 2012-01-12 LAB — CBC
HCT: 32.6 % — ABNORMAL LOW (ref 40.0–52.0)
HGB: 11.3 g/dL — ABNORMAL LOW (ref 13.0–18.0)
MCH: 33.2 pg (ref 26.0–34.0)
MCHC: 34.7 g/dL (ref 32.0–36.0)
MCV: 96 fL (ref 80–100)
RBC: 3.41 10*6/uL — ABNORMAL LOW (ref 4.40–5.90)
WBC: 1.4 10*3/uL — CL (ref 3.8–10.6)

## 2012-01-12 LAB — COMPREHENSIVE METABOLIC PANEL
Albumin: 3.6 g/dL (ref 3.4–5.0)
BUN: 9 mg/dL (ref 7–18)
Bilirubin,Total: 0.2 mg/dL (ref 0.2–1.0)
Creatinine: 0.8 mg/dL (ref 0.60–1.30)
EGFR (African American): >60
Glucose: 85 mg/dL (ref 65–99)
Potassium: 3.6 mmol/L (ref 3.5–5.1)
SGOT(AST): 23 U/L (ref 15–37)
SGPT (ALT): 24 U/L (ref 12–78)
Sodium: 144 mmol/L (ref 136–145)
Total Protein: 7.4 g/dL (ref 6.4–8.2)

## 2012-01-19 ENCOUNTER — Ambulatory Visit: Payer: Self-pay | Admitting: Internal Medicine

## 2012-01-19 LAB — CBC CANCER CENTER
Basophil #: 0 x10 3/mm (ref 0.0–0.1)
Basophil %: 1.1 %
Eosinophil %: 5.1 %
HCT: 33.2 % — ABNORMAL LOW (ref 40.0–52.0)
Lymphocyte %: 17.2 %
MCV: 97 fL (ref 80–100)
Monocyte #: 0.3 x10 3/mm (ref 0.2–1.0)
Neutrophil %: 55.5 %
RBC: 3.41 10*6/uL — ABNORMAL LOW (ref 4.40–5.90)
WBC: 1.4 x10 3/mm — CL (ref 3.8–10.6)

## 2012-01-26 LAB — CBC CANCER CENTER
Basophil %: 1.6 %
Eosinophil #: 0.1 x10 3/mm (ref 0.0–0.7)
Eosinophil %: 7 %
HCT: 33.9 % — ABNORMAL LOW (ref 40.0–52.0)
HGB: 11.3 g/dL — ABNORMAL LOW (ref 13.0–18.0)
MCH: 32.2 pg (ref 26.0–34.0)
MCHC: 33.3 g/dL (ref 32.0–36.0)
MCV: 97 fL (ref 80–100)
Monocyte #: 0.3 x10 3/mm (ref 0.2–1.0)
Neutrophil #: 0.8 x10 3/mm — ABNORMAL LOW (ref 1.4–6.5)
Neutrophil %: 54 %
WBC: 1.4 x10 3/mm — CL (ref 3.8–10.6)

## 2012-01-27 LAB — CANCER ANTIGEN 19-9: CA 19-9: 448 U/mL — ABNORMAL HIGH (ref 0–35)

## 2012-02-02 LAB — COMPREHENSIVE METABOLIC PANEL
Albumin: 3.5 g/dL (ref 3.4–5.0)
Anion Gap: 10 (ref 7–16)
BUN: 11 mg/dL (ref 7–18)
Bilirubin,Total: 0.2 mg/dL (ref 0.2–1.0)
Chloride: 109 mmol/L — ABNORMAL HIGH (ref 98–107)
Co2: 28 mmol/L (ref 21–32)
Creatinine: 0.81 mg/dL (ref 0.60–1.30)
EGFR (African American): >60
EGFR (Non-African Amer.): >60
Glucose: 90 mg/dL (ref 65–99)
Osmolality: 291 (ref 275–301)
Potassium: 3.5 mmol/L (ref 3.5–5.1)
SGPT (ALT): 37 U/L (ref 12–78)
Sodium: 147 mmol/L — ABNORMAL HIGH (ref 136–145)
Total Protein: 7.3 g/dL (ref 6.4–8.2)

## 2012-02-02 LAB — CBC CANCER CENTER
Basophil #: 0 x10 3/mm (ref 0.0–0.1)
Basophil %: 1.6 %
Eosinophil %: 6.9 %
HCT: 33.7 % — ABNORMAL LOW (ref 40.0–52.0)
HGB: 11.1 g/dL — ABNORMAL LOW (ref 13.0–18.0)
Lymphocyte #: 0.2 x10 3/mm — ABNORMAL LOW (ref 1.0–3.6)
Lymphocyte %: 17.6 %
MCHC: 33 g/dL (ref 32.0–36.0)
MCV: 97 fL (ref 80–100)
Monocyte %: 25 %
Neutrophil #: 0.7 x10 3/mm — ABNORMAL LOW (ref 1.4–6.5)
WBC: 1.4 x10 3/mm — CL (ref 3.8–10.6)

## 2012-02-19 ENCOUNTER — Ambulatory Visit: Payer: Self-pay | Admitting: Internal Medicine

## 2012-03-05 ENCOUNTER — Emergency Department: Payer: Self-pay | Admitting: Emergency Medicine

## 2012-03-05 LAB — URINALYSIS, COMPLETE
Blood: NEGATIVE
Nitrite: NEGATIVE
Ph: 6 (ref 4.5–8.0)
Protein: NEGATIVE
RBC,UR: <1 /HPF (ref 0–5)
Specific Gravity: 1.024 (ref 1.003–1.030)

## 2012-03-05 LAB — COMPREHENSIVE METABOLIC PANEL
Albumin: 3.8 g/dL (ref 3.4–5.0)
Anion Gap: 4 — ABNORMAL LOW (ref 7–16)
BUN: 11 mg/dL (ref 7–18)
Calcium, Total: 9 mg/dL (ref 8.5–10.1)
Creatinine: 0.93 mg/dL (ref 0.60–1.30)
Glucose: 70 mg/dL (ref 65–99)
Osmolality: 283 (ref 275–301)
SGOT(AST): 30 U/L (ref 15–37)
Sodium: 143 mmol/L (ref 136–145)
Total Protein: 8.1 g/dL (ref 6.4–8.2)

## 2012-03-05 LAB — CBC
HCT: 35.4 % — ABNORMAL LOW (ref 40.0–52.0)
HGB: 12.2 g/dL — ABNORMAL LOW (ref 13.0–18.0)
MCH: 32.4 pg (ref 26.0–34.0)
MCHC: 34.3 g/dL (ref 32.0–36.0)
MCV: 94 fL (ref 80–100)
Platelet: 178 10*3/uL (ref 150–440)
RBC: 3.75 10*6/uL — ABNORMAL LOW (ref 4.40–5.90)

## 2012-03-05 LAB — LIPASE, BLOOD: Lipase: 36 U/L — ABNORMAL LOW (ref 73–393)

## 2012-03-08 ENCOUNTER — Ambulatory Visit: Payer: Self-pay | Admitting: Internal Medicine

## 2012-03-08 LAB — CBC CANCER CENTER
Basophil #: 0 x10 3/mm (ref 0.0–0.1)
Eosinophil #: 0.3 x10 3/mm (ref 0.0–0.7)
HCT: 35.3 % — ABNORMAL LOW (ref 40.0–52.0)
Lymphocyte #: 0.4 x10 3/mm — ABNORMAL LOW (ref 1.0–3.6)
Lymphocyte %: 16.2 %
MCH: 31.6 pg (ref 26.0–34.0)
MCV: 95 fL (ref 80–100)
Monocyte %: 16.8 %
Platelet: 171 x10 3/mm (ref 150–440)
RBC: 3.7 10*6/uL — ABNORMAL LOW (ref 4.40–5.90)
WBC: 2.3 x10 3/mm — ABNORMAL LOW (ref 3.8–10.6)

## 2012-03-08 LAB — HEPATIC FUNCTION PANEL A (ARMC)
Albumin: 3.6 g/dL (ref 3.4–5.0)
Alkaline Phosphatase: 109 U/L (ref 50–136)
Bilirubin, Direct: 0.1 mg/dL (ref 0.00–0.20)
Bilirubin,Total: 0.2 mg/dL (ref 0.2–1.0)
SGOT(AST): 21 U/L (ref 15–37)
SGPT (ALT): 22 U/L (ref 12–78)
Total Protein: 7.9 g/dL (ref 6.4–8.2)

## 2012-03-08 LAB — CREATININE, SERUM
Creatinine: 0.9 mg/dL (ref 0.60–1.30)
EGFR (African American): >60
EGFR (Non-African Amer.): >60

## 2012-03-08 LAB — PROTIME-INR: Prothrombin Time: 13.4 secs (ref 11.5–14.7)

## 2012-03-09 LAB — CANCER ANTIGEN 19-9: CA 19-9: 1700 U/mL — ABNORMAL HIGH (ref 0–35)

## 2012-03-20 ENCOUNTER — Ambulatory Visit: Payer: Self-pay | Admitting: Internal Medicine

## 2012-03-29 ENCOUNTER — Ambulatory Visit: Payer: Self-pay | Admitting: Internal Medicine

## 2012-04-15 LAB — CBC
HGB: 13.3 g/dL (ref 13.0–18.0)
MCH: 31.5 pg (ref 26.0–34.0)
MCV: 93 fL (ref 80–100)
Platelet: 244 10*3/uL (ref 150–440)
RBC: 4.24 10*6/uL — ABNORMAL LOW (ref 4.40–5.90)
RDW: 12.1 % (ref 11.5–14.5)

## 2012-04-15 LAB — COMPREHENSIVE METABOLIC PANEL
Albumin: 4.2 g/dL (ref 3.4–5.0)
Alkaline Phosphatase: 136 U/L (ref 50–136)
Anion Gap: 8 (ref 7–16)
BUN: 14 mg/dL (ref 7–18)
Calcium, Total: 9.8 mg/dL (ref 8.5–10.1)
Co2: 28 mmol/L (ref 21–32)
EGFR (African American): >60
EGFR (Non-African Amer.): >60
Glucose: 88 mg/dL (ref 65–99)
SGOT(AST): 29 U/L (ref 15–37)
Sodium: 141 mmol/L (ref 136–145)
Total Protein: 9 g/dL — ABNORMAL HIGH (ref 6.4–8.2)

## 2012-04-15 LAB — URINALYSIS, COMPLETE
Bacteria: NONE SEEN
Bilirubin,UR: NEGATIVE
Blood: NEGATIVE
Glucose,UR: NEGATIVE mg/dL (ref 0–75)
Hyaline Cast: 3
Leukocyte Esterase: NEGATIVE
Nitrite: NEGATIVE
Specific Gravity: 1.031 (ref 1.003–1.030)
Squamous Epithelial: NONE SEEN
WBC UR: 2 /HPF (ref 0–5)

## 2012-04-15 LAB — DIFFERENTIAL
Basophil %: 0.6 %
Eosinophil #: 0 10*3/uL (ref 0.0–0.7)
Lymphocyte #: 0.3 10*3/uL — ABNORMAL LOW (ref 1.0–3.6)
Monocyte #: 0.4 x10 3/mm (ref 0.2–1.0)
Neutrophil #: 1.2 10*3/uL — ABNORMAL LOW (ref 1.4–6.5)
Neutrophil %: 62 %

## 2012-04-17 ENCOUNTER — Inpatient Hospital Stay: Payer: Self-pay | Admitting: Specialist

## 2012-04-17 LAB — CBC WITH DIFFERENTIAL/PLATELET
Basophil #: 0 10*3/uL (ref 0.0–0.1)
Eosinophil #: 0.1 10*3/uL (ref 0.0–0.7)
Eosinophil %: 3.6 %
Lymphocyte #: 0.2 10*3/uL — ABNORMAL LOW (ref 1.0–3.6)
Lymphocyte %: 8.7 %
MCHC: 32.8 g/dL (ref 32.0–36.0)
MCV: 93 fL (ref 80–100)
Monocyte #: 0.4 x10 3/mm (ref 0.2–1.0)
Neutrophil %: 70.7 %
Platelet: 170 10*3/uL (ref 150–440)
RBC: 3.38 10*6/uL — ABNORMAL LOW (ref 4.40–5.90)
RDW: 11.9 % (ref 11.5–14.5)

## 2012-04-18 ENCOUNTER — Emergency Department: Payer: Self-pay | Admitting: Unknown Physician Specialty

## 2012-04-18 LAB — COMPREHENSIVE METABOLIC PANEL
Anion Gap: 6 — ABNORMAL LOW (ref 7–16)
Calcium, Total: 9.1 mg/dL (ref 8.5–10.1)
Chloride: 107 mmol/L (ref 98–107)
Co2: 30 mmol/L (ref 21–32)
EGFR (African American): >60
EGFR (Non-African Amer.): >60
Osmolality: 281 (ref 275–301)
Potassium: 3.5 mmol/L (ref 3.5–5.1)
Sodium: 143 mmol/L (ref 136–145)

## 2012-04-18 LAB — URINALYSIS, COMPLETE
Glucose,UR: NEGATIVE mg/dL (ref 0–75)
Leukocyte Esterase: NEGATIVE
Nitrite: NEGATIVE
Ph: 6 (ref 4.5–8.0)
Protein: NEGATIVE
RBC,UR: 1 /HPF (ref 0–5)
WBC UR: 1 /HPF (ref 0–5)

## 2012-04-18 LAB — CBC
MCH: 30.5 pg (ref 26.0–34.0)
Platelet: 203 10*3/uL (ref 150–440)
RBC: 3.97 10*6/uL — ABNORMAL LOW (ref 4.40–5.90)
RDW: 11.8 % (ref 11.5–14.5)
WBC: 2.4 10*3/uL — ABNORMAL LOW (ref 3.8–10.6)

## 2012-04-20 ENCOUNTER — Ambulatory Visit: Payer: Self-pay | Admitting: Internal Medicine

## 2012-04-26 LAB — BASIC METABOLIC PANEL
Anion Gap: 10 (ref 7–16)
Calcium, Total: 9 mg/dL (ref 8.5–10.1)
Chloride: 108 mmol/L — ABNORMAL HIGH (ref 98–107)
Co2: 26 mmol/L (ref 21–32)
Creatinine: 0.84 mg/dL (ref 0.60–1.30)
EGFR (Non-African Amer.): >60
Osmolality: 287 (ref 275–301)
Potassium: 3.4 mmol/L — ABNORMAL LOW (ref 3.5–5.1)
Sodium: 144 mmol/L (ref 136–145)

## 2012-04-26 LAB — CBC CANCER CENTER
Basophil #: 0 x10 3/mm (ref 0.0–0.1)
Basophil %: 0.6 %
Eosinophil #: 0.1 x10 3/mm (ref 0.0–0.7)
HCT: 34.1 % — ABNORMAL LOW (ref 40.0–52.0)
HGB: 11.8 g/dL — ABNORMAL LOW (ref 13.0–18.0)
Lymphocyte #: 0.4 x10 3/mm — ABNORMAL LOW (ref 1.0–3.6)
Lymphocyte %: 12.3 %
MCHC: 34.6 g/dL (ref 32.0–36.0)
MCV: 91 fL (ref 80–100)
Monocyte #: 0.4 x10 3/mm (ref 0.2–1.0)
Monocyte %: 11.9 %
Neutrophil %: 72.6 %
Platelet: 244 x10 3/mm (ref 150–440)
RBC: 3.74 10*6/uL — ABNORMAL LOW (ref 4.40–5.90)
WBC: 3 x10 3/mm — ABNORMAL LOW (ref 3.8–10.6)

## 2012-04-26 LAB — HEPATIC FUNCTION PANEL A (ARMC)
Albumin: 3.5 g/dL (ref 3.4–5.0)
Bilirubin,Total: 0.4 mg/dL (ref 0.2–1.0)
SGOT(AST): 23 U/L (ref 15–37)
SGPT (ALT): 31 U/L (ref 12–78)
Total Protein: 7.6 g/dL (ref 6.4–8.2)

## 2012-04-27 ENCOUNTER — Emergency Department: Payer: Self-pay

## 2012-04-27 LAB — CBC
HCT: 34.5 % — ABNORMAL LOW (ref 40.0–52.0)
MCHC: 32.3 g/dL (ref 32.0–36.0)
Platelet: 275 10*3/uL (ref 150–440)
WBC: 3.8 10*3/uL (ref 3.8–10.6)

## 2012-04-27 LAB — COMPREHENSIVE METABOLIC PANEL
Alkaline Phosphatase: 101 U/L (ref 50–136)
BUN: 16 mg/dL (ref 7–18)
Calcium, Total: 9.1 mg/dL (ref 8.5–10.1)
Co2: 28 mmol/L (ref 21–32)
Creatinine: 0.85 mg/dL (ref 0.60–1.30)
EGFR (African American): >60
Osmolality: 284 (ref 275–301)
Potassium: 3.6 mmol/L (ref 3.5–5.1)
SGOT(AST): 21 U/L (ref 15–37)
SGPT (ALT): 26 U/L (ref 12–78)

## 2012-04-28 ENCOUNTER — Inpatient Hospital Stay: Payer: Self-pay | Admitting: Student

## 2012-04-28 LAB — URINALYSIS, COMPLETE
Bilirubin,UR: NEGATIVE
Glucose,UR: NEGATIVE mg/dL (ref 0–75)
Squamous Epithelial: NONE SEEN

## 2012-04-28 LAB — CBC WITH DIFFERENTIAL/PLATELET
Basophil %: 0.7 %
HCT: 36.1 % — ABNORMAL LOW (ref 40.0–52.0)
Lymphocyte %: 9.2 %
MCHC: 34.6 g/dL (ref 32.0–36.0)
MCV: 93 fL (ref 80–100)
Monocyte %: 7.7 %
Neutrophil %: 82.3 %
RBC: 3.89 10*6/uL — ABNORMAL LOW (ref 4.40–5.90)
RDW: 12.1 % (ref 11.5–14.5)
WBC: 2.6 10*3/uL — ABNORMAL LOW (ref 3.8–10.6)

## 2012-04-28 LAB — COMPREHENSIVE METABOLIC PANEL
Albumin: 3.9 g/dL (ref 3.4–5.0)
Alkaline Phosphatase: 106 U/L (ref 50–136)
Anion Gap: 8 (ref 7–16)
BUN: 15 mg/dL (ref 7–18)
Bilirubin,Total: 0.5 mg/dL (ref 0.2–1.0)
Chloride: 107 mmol/L (ref 98–107)
Co2: 27 mmol/L (ref 21–32)
Creatinine: 0.83 mg/dL (ref 0.60–1.30)
EGFR (African American): >60
Potassium: 3.7 mmol/L (ref 3.5–5.1)
Sodium: 142 mmol/L (ref 136–145)

## 2012-04-28 LAB — TROPONIN I: Troponin-I: <0.02 ng/mL

## 2012-04-28 LAB — LIPASE, BLOOD: Lipase: 39 U/L — ABNORMAL LOW (ref 73–393)

## 2012-05-02 LAB — COMPREHENSIVE METABOLIC PANEL WITH GFR
Albumin: 3.9 g/dL
Alkaline Phosphatase: 109 U/L
Anion Gap: 9
BUN: 19 mg/dL — ABNORMAL HIGH
Bilirubin,Total: 0.6 mg/dL
Calcium, Total: 9.6 mg/dL
Chloride: 103 mmol/L
Co2: 26 mmol/L
Creatinine: 0.92 mg/dL
EGFR (African American): >60
EGFR (Non-African Amer.): >60
Glucose: 88 mg/dL
Osmolality: 277
Potassium: 3.7 mmol/L
SGOT(AST): 34 U/L
SGPT (ALT): 30 U/L
Sodium: 138 mmol/L
Total Protein: 8.5 g/dL — ABNORMAL HIGH

## 2012-05-02 LAB — CBC
HCT: 40.7 % (ref 40.0–52.0)
HGB: 14.1 g/dL (ref 13.0–18.0)
MCH: 31.7 pg (ref 26.0–34.0)
MCHC: 34.5 g/dL (ref 32.0–36.0)
MCV: 92 fL (ref 80–100)
Platelet: 276 10*3/uL (ref 150–440)
RDW: 12.2 % (ref 11.5–14.5)
WBC: 1.6 10*3/uL — CL (ref 3.8–10.6)

## 2012-05-02 LAB — URINALYSIS, COMPLETE
Blood: NEGATIVE
Nitrite: NEGATIVE
Ph: 5 (ref 4.5–8.0)
RBC,UR: 1 /HPF (ref 0–5)
Specific Gravity: 1.034 (ref 1.003–1.030)
Squamous Epithelial: NONE SEEN
WBC UR: 2 /HPF (ref 0–5)

## 2012-05-02 LAB — LIPASE, BLOOD: Lipase: 41 U/L — ABNORMAL LOW

## 2012-05-03 LAB — CBC WITH DIFFERENTIAL/PLATELET
Basophil #: 0 10*3/uL (ref 0.0–0.1)
Basophil %: 0.7 %
Eosinophil #: 0 10*3/uL (ref 0.0–0.7)
Eosinophil %: 2.8 %
HGB: 11.1 g/dL — ABNORMAL LOW (ref 13.0–18.0)
Lymphocyte #: 0.3 10*3/uL — ABNORMAL LOW (ref 1.0–3.6)
MCH: 31.3 pg (ref 26.0–34.0)
MCV: 91 fL (ref 80–100)
Monocyte #: 0.2 x10 3/mm (ref 0.2–1.0)
Monocyte %: 10.6 %
Platelet: 203 10*3/uL (ref 150–440)
RBC: 3.54 10*6/uL — ABNORMAL LOW (ref 4.40–5.90)
RDW: 12.2 % (ref 11.5–14.5)
WBC: 1.8 10*3/uL — CL (ref 3.8–10.6)

## 2012-05-03 LAB — COMPREHENSIVE METABOLIC PANEL
Albumin: 3.1 g/dL — ABNORMAL LOW (ref 3.4–5.0)
Alkaline Phosphatase: 93 U/L (ref 50–136)
Anion Gap: 7 (ref 7–16)
BUN: 17 mg/dL (ref 7–18)
Calcium, Total: 8.6 mg/dL (ref 8.5–10.1)
Chloride: 108 mmol/L — ABNORMAL HIGH (ref 98–107)
Co2: 27 mmol/L (ref 21–32)
EGFR (African American): >60
Glucose: 86 mg/dL (ref 65–99)
Potassium: 3.9 mmol/L (ref 3.5–5.1)
SGOT(AST): 49 U/L — ABNORMAL HIGH (ref 15–37)
SGPT (ALT): 36 U/L (ref 12–78)
Sodium: 142 mmol/L (ref 136–145)
Total Protein: 6.7 g/dL (ref 6.4–8.2)

## 2012-05-05 ENCOUNTER — Inpatient Hospital Stay: Payer: Self-pay | Admitting: Diagnostic Radiology

## 2012-05-05 LAB — CBC WITH DIFFERENTIAL/PLATELET
Eosinophil %: 2.1 %
HCT: 31.7 % — ABNORMAL LOW (ref 40.0–52.0)
Lymphocyte #: 0.2 10*3/uL — ABNORMAL LOW (ref 1.0–3.6)
Lymphocyte %: 7.3 %
MCHC: 34.6 g/dL (ref 32.0–36.0)
Monocyte #: 0.3 x10 3/mm (ref 0.2–1.0)
Monocyte %: 12.7 %
RDW: 12.2 % (ref 11.5–14.5)
WBC: 2.5 10*3/uL — ABNORMAL LOW (ref 3.8–10.6)

## 2012-05-06 LAB — CBC WITH DIFFERENTIAL/PLATELET
Basophil #: 0 10*3/uL (ref 0.0–0.1)
Eosinophil #: 0 10*3/uL (ref 0.0–0.7)
Lymphocyte #: 0.2 10*3/uL — ABNORMAL LOW (ref 1.0–3.6)
Lymphocyte %: 7.5 %
MCH: 31.8 pg (ref 26.0–34.0)
MCHC: 35 g/dL (ref 32.0–36.0)
MCV: 91 fL (ref 80–100)
Monocyte #: 0.4 x10 3/mm (ref 0.2–1.0)
Monocyte %: 17.2 %
Neutrophil #: 1.8 10*3/uL (ref 1.4–6.5)
Neutrophil %: 74.3 %
Platelet: 153 10*3/uL (ref 150–440)
RBC: 3.18 10*6/uL — ABNORMAL LOW (ref 4.40–5.90)
WBC: 2.4 10*3/uL — ABNORMAL LOW (ref 3.8–10.6)

## 2012-05-07 LAB — CREATININE, SERUM
Creatinine: 0.65 mg/dL (ref 0.60–1.30)
EGFR (African American): >60
EGFR (Non-African Amer.): >60

## 2012-05-10 ENCOUNTER — Ambulatory Visit: Payer: Self-pay | Admitting: Internal Medicine

## 2012-05-10 LAB — BASIC METABOLIC PANEL
BUN: 10 mg/dL (ref 7–18)
Co2: 32 mmol/L (ref 21–32)
EGFR (Non-African Amer.): >60
Glucose: 93 mg/dL (ref 65–99)
Osmolality: 286 (ref 275–301)
Potassium: 3.4 mmol/L — ABNORMAL LOW (ref 3.5–5.1)
Sodium: 144 mmol/L (ref 136–145)

## 2012-05-10 LAB — CBC CANCER CENTER
HCT: 32.5 % — ABNORMAL LOW (ref 40.0–52.0)
HGB: 11.3 g/dL — ABNORMAL LOW (ref 13.0–18.0)
Lymphocyte #: 0.3 x10 3/mm — ABNORMAL LOW (ref 1.0–3.6)
Lymphocyte %: 14.7 %
MCH: 31.5 pg (ref 26.0–34.0)
MCV: 90 fL (ref 80–100)
Monocyte #: 0.7 x10 3/mm (ref 0.2–1.0)
Monocyte %: 36.1 %
Neutrophil #: 0.9 x10 3/mm — ABNORMAL LOW (ref 1.4–6.5)
Neutrophil %: 46.6 %
Platelet: 221 x10 3/mm (ref 150–440)
RBC: 3.6 10*6/uL — ABNORMAL LOW (ref 4.40–5.90)
RDW: 12.2 % (ref 11.5–14.5)
WBC: 1.9 x10 3/mm — CL (ref 3.8–10.6)

## 2012-05-12 ENCOUNTER — Emergency Department: Payer: Self-pay | Admitting: Internal Medicine

## 2012-05-12 ENCOUNTER — Emergency Department: Payer: Self-pay | Admitting: Emergency Medicine

## 2012-05-12 LAB — CBC WITH DIFFERENTIAL/PLATELET
Basophil %: 0.6 %
Eosinophil %: 0.3 %
Lymphocyte %: 7 %
MCHC: 33.4 g/dL (ref 32.0–36.0)
Platelet: 307 10*3/uL (ref 150–440)
RBC: 3.47 10*6/uL — ABNORMAL LOW (ref 4.40–5.90)
RDW: 12.5 % (ref 11.5–14.5)
WBC: 2.9 10*3/uL — ABNORMAL LOW (ref 3.8–10.6)

## 2012-05-12 LAB — COMPREHENSIVE METABOLIC PANEL
Albumin: 2.8 g/dL — ABNORMAL LOW (ref 3.4–5.0)
Alkaline Phosphatase: 99 U/L (ref 50–136)
Anion Gap: 11 (ref 7–16)
BUN: 18 mg/dL (ref 7–18)
Bilirubin,Total: 0.5 mg/dL (ref 0.2–1.0)
Calcium, Total: 8.2 mg/dL — ABNORMAL LOW (ref 8.5–10.1)
Chloride: 111 mmol/L — ABNORMAL HIGH (ref 98–107)
Co2: 23 mmol/L (ref 21–32)
EGFR (African American): >60
EGFR (Non-African Amer.): >60
Glucose: 77 mg/dL (ref 65–99)
Osmolality: 289 (ref 275–301)
SGOT(AST): 33 U/L (ref 15–37)
Total Protein: 7.1 g/dL (ref 6.4–8.2)

## 2012-05-12 LAB — LIPASE, BLOOD: Lipase: 38 U/L — ABNORMAL LOW (ref 73–393)

## 2012-05-13 ENCOUNTER — Emergency Department: Payer: Self-pay | Admitting: Emergency Medicine

## 2012-05-13 LAB — COMPREHENSIVE METABOLIC PANEL
Alkaline Phosphatase: 101 U/L (ref 50–136)
BUN: 15 mg/dL (ref 7–18)
Bilirubin,Total: 0.4 mg/dL (ref 0.2–1.0)
Co2: 24 mmol/L (ref 21–32)
Creatinine: 0.68 mg/dL (ref 0.60–1.30)
EGFR (African American): >60
EGFR (Non-African Amer.): >60
Potassium: 3.4 mmol/L — ABNORMAL LOW (ref 3.5–5.1)
SGOT(AST): 60 U/L — ABNORMAL HIGH (ref 15–37)
SGPT (ALT): 34 U/L (ref 12–78)
Sodium: 143 mmol/L (ref 136–145)

## 2012-05-13 LAB — CBC WITH DIFFERENTIAL/PLATELET
Basophil #: 0 10*3/uL (ref 0.0–0.1)
Eosinophil #: 0 10*3/uL (ref 0.0–0.7)
HCT: 30.7 % — ABNORMAL LOW (ref 40.0–52.0)
HGB: 10.1 g/dL — ABNORMAL LOW (ref 13.0–18.0)
Lymphocyte %: 10.3 %
MCH: 30 pg (ref 26.0–34.0)
MCHC: 32.9 g/dL (ref 32.0–36.0)
MCV: 91 fL (ref 80–100)
Monocyte #: 0.4 x10 3/mm (ref 0.2–1.0)
Monocyte %: 15 %
Neutrophil %: 74.3 %
Platelet: 340 10*3/uL (ref 150–440)

## 2012-05-21 ENCOUNTER — Ambulatory Visit: Payer: Self-pay | Admitting: Internal Medicine

## 2012-06-22 ENCOUNTER — Ambulatory Visit: Payer: Self-pay | Admitting: Internal Medicine

## 2012-07-19 DEATH — deceased

## 2013-06-15 IMAGING — CT CT HEAD WITHOUT AND WITH CONTRAST
1 of 2 series · 13 of 30 positions shown, 17 images · non-contrast
Comparison: none

REASON FOR EXAM: HA pancreatic CA pt on anticoagulation   Call Report [id number]
COMMENTS:

[Series 2: without · axial · non-contrast · 0.41mm/px · z∈[-140,-20]mm · 13 of 30 slices shown, 17 images]
[im 3/30  brain]
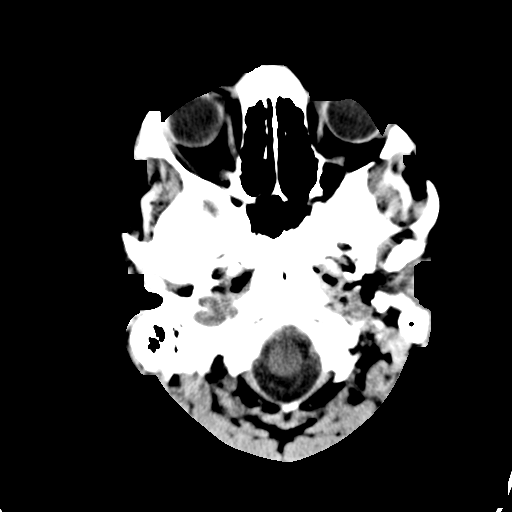
[im 3/30  bone]
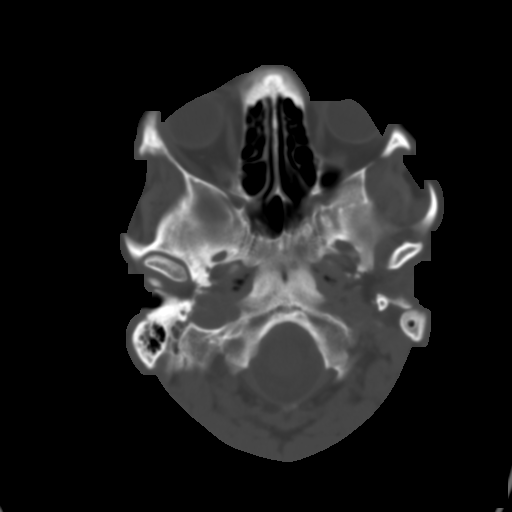
[im 5/30  brain]
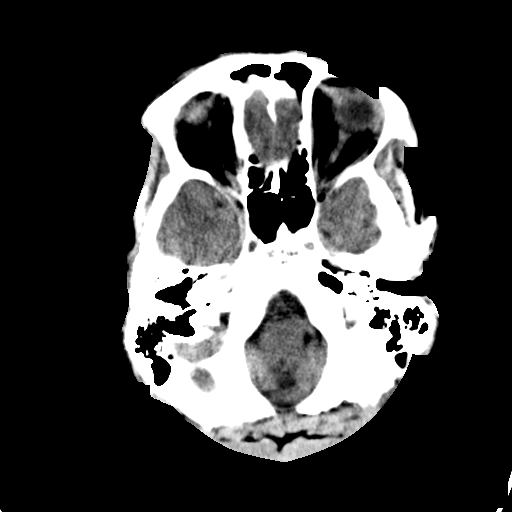
[im 7/30  brain]
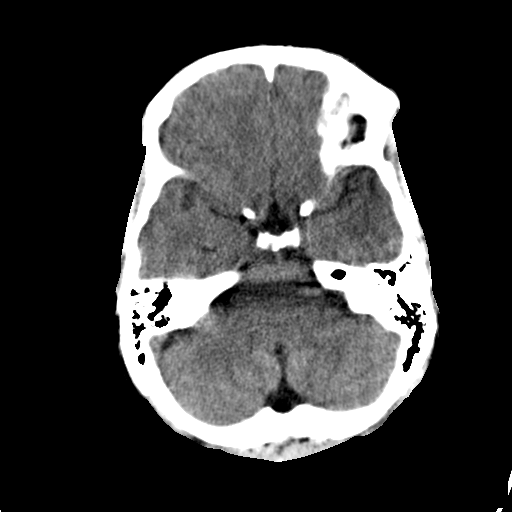
[im 9/30  brain]
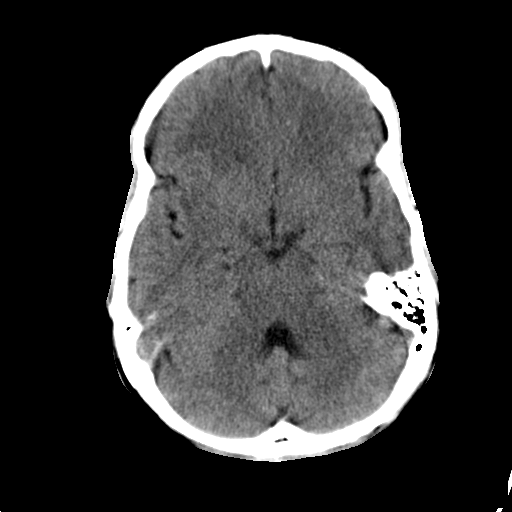
[im 11/30  brain]
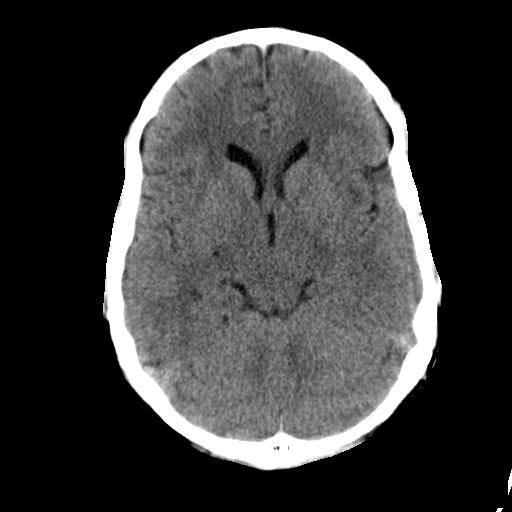
[im 11/30  bone]
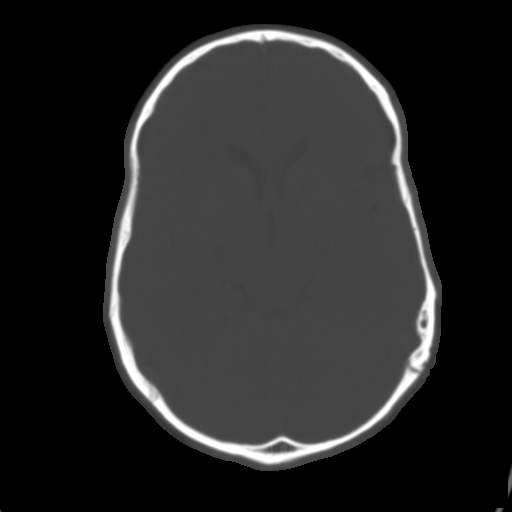
[im 13/30  brain]
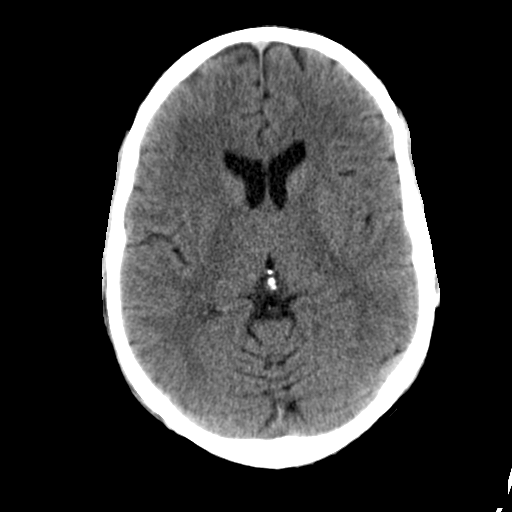
[im 15/30  brain]
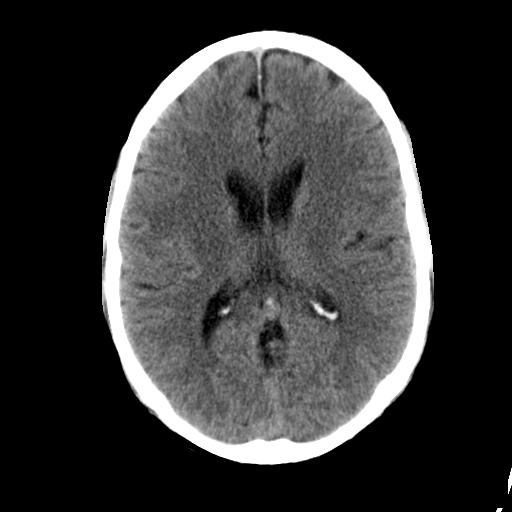
[im 17/30  brain]
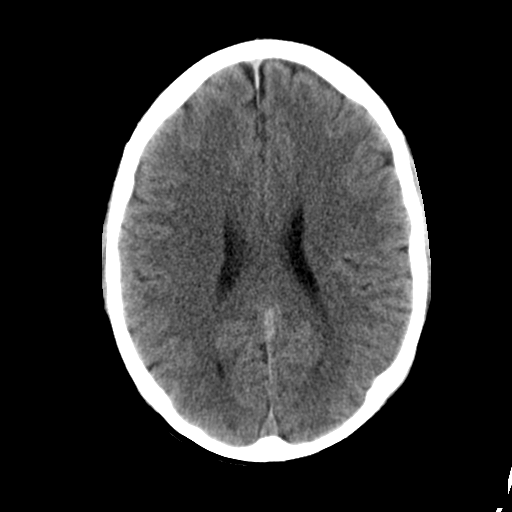
[im 19/30  brain]
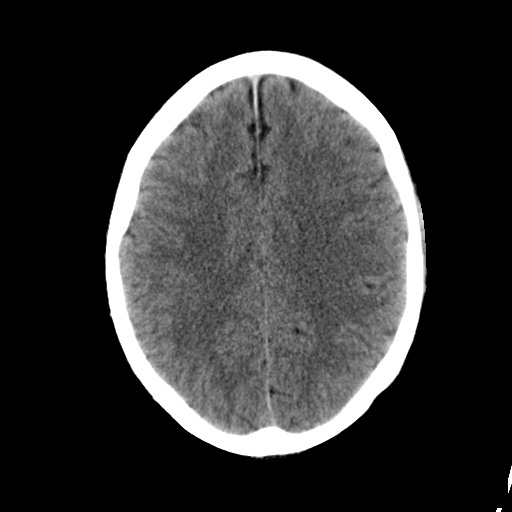
[im 19/30  bone]
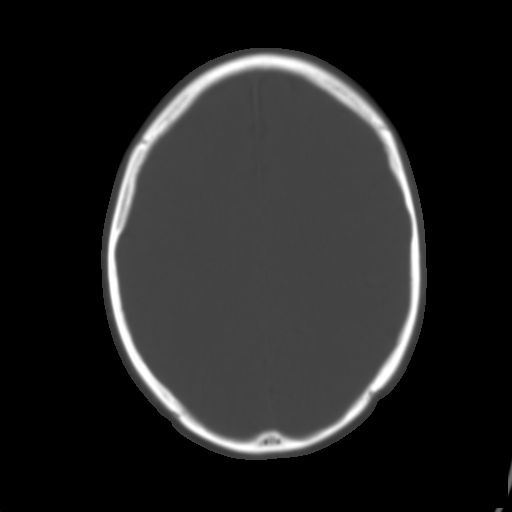
[im 21/30  brain]
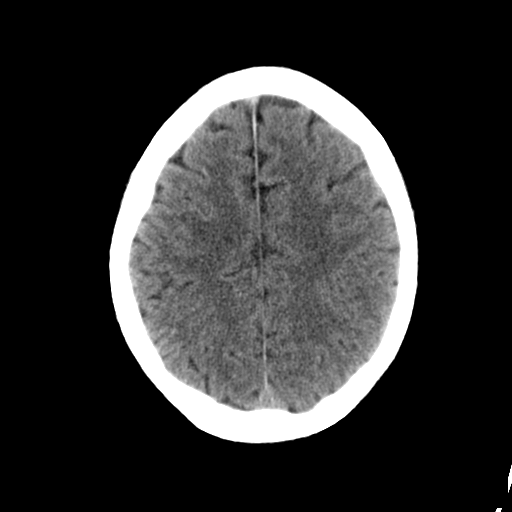
[im 23/30  brain]
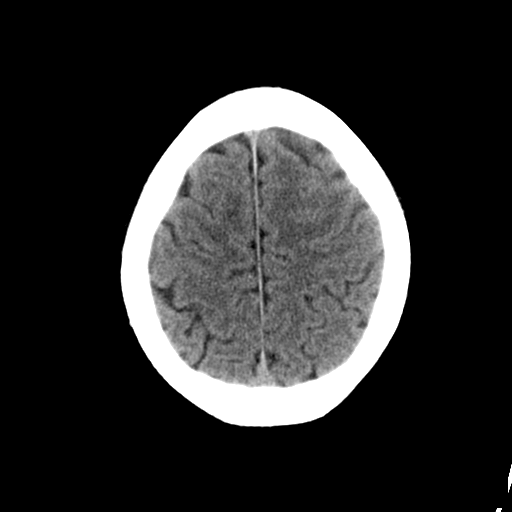
[im 25/30  brain]
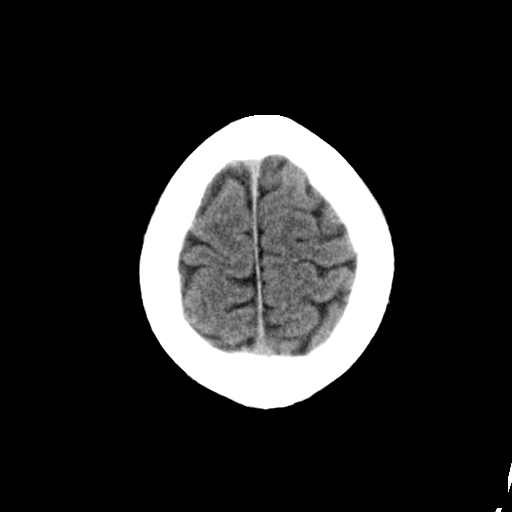
[im 27/30  brain]
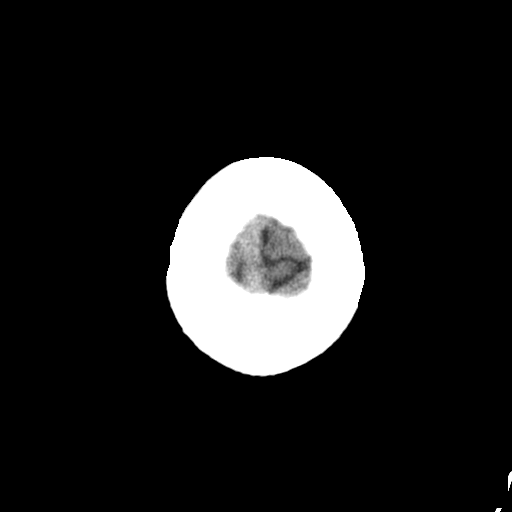
[im 27/30  bone]
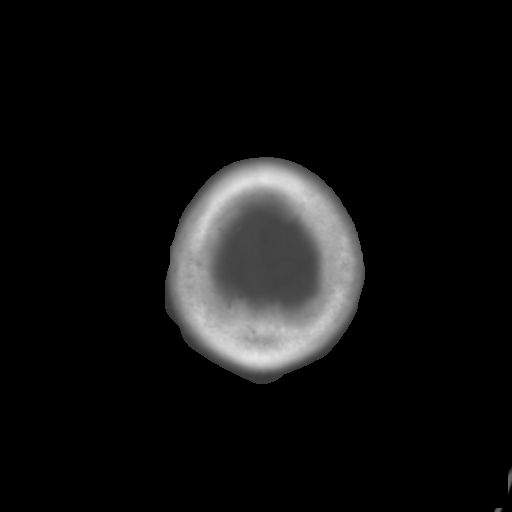

[13 of 30 positions shown; findings below may reference images not displayed]

PROCEDURE:     CT  - CT HEAD W/WO  - December 23, 2011 [DATE]

RESULT:     Axial CT scanning was performed through the brain both prior to
and following administration of 50 cc of 4sovue-DZZ. Reconstructions were
obtained at 5 mm intervals and slice thicknesses. Comparison is made to the
study 16 January, 2011.

The noncontrast images reveal the ventricles to be normal in size and
position. There is no intracranial mass effect. A stable hypodensity is
present near the temporal tip of the right lateral ventricle which may
reflect a dilated Virchow Robin space. There is no evidence of an acute
intracranial hemorrhage nor of intracranial mass effect. Following contrast
administration no abnormal enhancement of the brain parenchyma is
demonstrated. At bone window settings no lytic nor blastic lesions are
demonstrated. A tiny retention cyst or polyp is present posteriorly in the
inferior aspect of the left maxillary sinus.
IMPRESSION: 1. There is no evidence of an acute intracranial hemorrhage.
2. There is no intracranial mass effect nor hydrocephalus.
3. There is likely a dilated Virchow Robin space on the right near the
temporal tip of the lateral ventricle. This is more conspicuous than in the
past but this may be due to volume averaging.

This report was called by the to Pang, Dr. Interisti nurse at [DATE] a.m. on
23 December, 2011.

[REDACTED]

## 2013-10-24 IMAGING — CR DG CHEST 1V PORT
1 series · 1 of 1 positions shown · non-contrast
Comparison: none

REASON FOR EXAM: diminished sounds on the R
COMMENTS:

[portable]
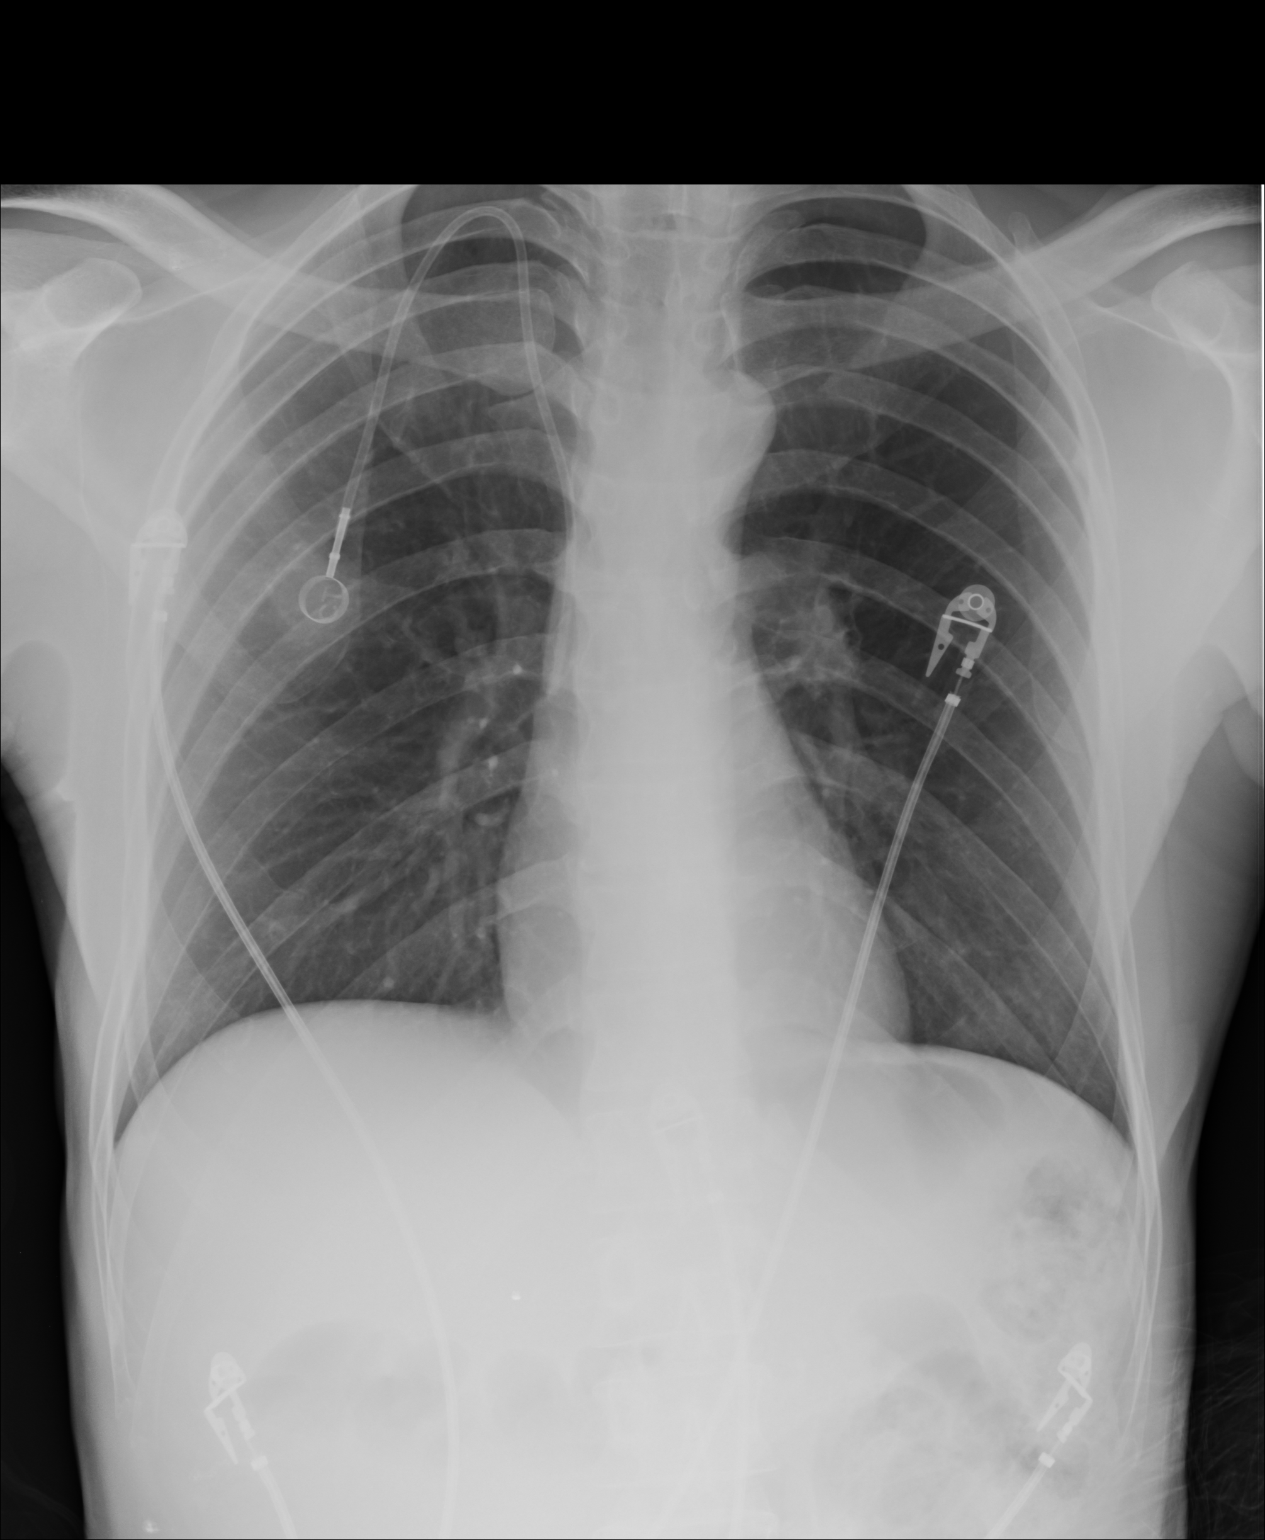

[1 of 1 positions shown; findings below may reference images not displayed]

PROCEDURE:     DXR - DXR PORTABLE CHEST SINGLE VIEW  - May 02, 2012  [DATE]

RESULT:     Comparison is made to the study June 29, 2011.

The lungs are well-expanded. There is no focal infiltrate. There is no
pneumothorax or pneumomediastinum. A Port-A-Cath appliance is in place on
the right. The cardiac silhouette is normal in size. The pulmonary
vascularity is not engorged. The mediastinum is normal in width.
IMPRESSION: There is no evidence of CHF nor of pneumonia nor other
acute cardiopulmonary abnormality. Mild hyperinflation may be voluntary or
could reflect underlying reactive airway disease.

[REDACTED]

## 2014-08-07 NOTE — Discharge Summary (Signed)
PATIENT NAME:  Tyler Love, Tyler Love MR#:  [id number] DATE OF BIRTH:  03-16-1966  DATE OF ADMISSION:  04/17/2012 DATE OF DISCHARGE:  04/18/2012  For a detailed note, please take a look at the history and physical done on admission on December 27th.   DIAGNOSES AT DISCHARGE: 1.  Abdominal pain and nausea secondary to typhlitis, also due to underlying pancreatic cancer.  2.  History of pulmonary embolism.  3.  Chronic leukopenia.  4.  Recurrent pancreatic cancer, status post chemoradiation.   DIET: The patient is being discharged on a regular diet.   ACTIVITY: As tolerated.   FOLLOWUP:  With Dr. Sherrlyn HockPandit in the next 2 to 3 days.   DISCHARGE MEDICATIONS:  MS Contin 15 mg b.i.d., Lovenox 100 mg subcu daily, Imitrex 25 mg q.8 hours as needed, oxycodone 5 mg every 6 hours as needed, Augmentin 500/125, 1 tab b.i.d. x8 days and Zofran 4 mg q.4 hours as needed for nausea and vomiting.   CONSULTANTS DURING THE HOSPITAL COURSE: Dr. Gerarda Fractionimothy Finnegan from oncology.   PERTINENT STUDIES DONE DURING THE HOSPITAL COURSE: A CT scan of the abdomen and pelvis done with contrast on admission showing bowel wall thickening in the  ascending colon with mild adjacent inflammatory changes maybe secondary to infectious or inflammatory etiology. Possibility of typhlitis given the patient's history of malignancy. Mild bowel wall thickening loop of the small bowel in the right hemiabdomen, infectious versus inflammatory.   BRIEF HOSPITAL COURSE: This is a 49 year old male with medical problems as mentioned above, presented to the hospital with abdominal pain and nausea.    1.  Abdominal pain and nausea. Most likely cause of patient's symptoms was secondary to his underlying pancreatic cancer complicated with suspected typhlitis given his leukopenia. The patient has chronic leukopenia secondary to his pancreatic cancer and chemoradiation. CT scan was suggestive of typhlitis, therefore, the patient was empirically started on IV  Zosyn. After getting supportive care with IV pain control, IV fluids and also IV antibiotics, the patient's clinical symptoms have significantly improved. He still has some abdominal pain but it is probably related to his pancreatic malignancy. He has had no diarrhea. He is taking p.o. well. His nauseous has resolved. He, therefore, presently is being discharged on p.o. Augmentin and also pain control along with MS Contin and oxycodone as needed. He will continue to follow up with Dr. Sherrlyn HockPandit as an outpatient in the next 3 to 4 days.  2.  History of pulmonary embolism. The patient is already on subcutaneous Lovenox at home which he was maintained on and he will continue.  3.  Chronic leukopenia. As mentioned, this is a chronic problem for the patient secondary to his pancreatic cancer and chemoradiation. [facility]  4.  Recurrent pancreatic cancer, status post chemoradiation. Again, the patient follows up with Dr. Sherrlyn HockPandit which he will continue followup.  [facility] and he will resume that upon discharge.   CODE STATUS: The patient is a full code.   TIME SPENT ON DISCHARGE: 40 minutes.      ____________________________ Rolly PancakeVivek J. Cherlynn KaiserSainani, MD vjs:cs D: 04/18/2012 14:00:00 ET T: 04/18/2012 15:36:17 ET JOB#: [id number]  cc: Sandeep R. Sherrlyn HockPandit, MD Rolly PancakeVivek J. Cherlynn KaiserSainani, MD, <Dictator>   Houston SirenVIVEK J SAINANI MD ELECTRONICALLY SIGNED 04/19/2012 20:59

## 2014-08-07 NOTE — H&P (Signed)
PATIENT NAME:  Tyler Love, Tyler Love MR#:  [id number] DATE OF BIRTH:  11/06/1965  DATE OF ADMISSION:  12/19/2011  PRIMARY CARE PHYSICIAN: Unassigned.   CHIEF COMPLAINT: Left-sided chest pain and epigastric pain.   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 Also, recently started on chemotherapy on 08/28 administered by Dr. Janese BanksSandeep Pandit, who presents to the ER with the chief complaint of left-sided chest pain as well as epigastric pain. The patient had similar pain on presentation on 08/28 and had a CT of the chest that was negative. The patient states that after starting chemotherapy, he has not had any fever, chills, rigors or cough. He does not have any difficulty swallowing. He has not had any recent weight loss. The patient denies any melena, hematochezia or hematemesis. The patient complains of dyspnea on exertion and admits to feeling anxious associated with some palpitations.   PAST MEDICAL HISTORY:  1. Bilateral pulmonary embolism. 2. Chronic vertigo. 3. History of gout. 4. 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   FAMILY HISTORY:  Sister had breast cancer in her 440s, otherwise unremarkable.   SOCIAL HISTORY: Denies any smoking, alcohol or recreational drug usage.   ALLERGIES: No known drug allergies.   HOME MEDICATIONS:  1. Valium 5 mg 1 tablet every eight hours as needed.  2. Promethazine 25 mg every four hours as needed.  3. Vicodin 5/325, one to two tablets every six hours as needed.  4. Venlafaxine 75 mg p.o. as needed.  5. Megace 40 mg suspension. 6. Lovenox 100 mg injection one dose once a day.  7. Ferrous sulfate 1 tablet p.o. daily.  8. MiraLAX 17 grams p.o. daily.  9. Zofran 4 mg every four hours as needed.   REVIEW OF SYSTEMS: CONSTITUTIONAL: Positive for fever, fatigue, weakness. No  weight loss or weight gain. EYES: No blurry vision, double vision, pain, redness, inflammation, glaucoma, or cataracts. ENT: No tinnitus, ear pain, hearing loss, seasonal allergies, postnasal drip. RESPIRATORY: No cough, wheezing, hemoptysis, dyspnea, asthma, painful respiration. CARDIOVASCULAR: No chest pain, orthopnea, edema, arrhythmia, dyspnea, palpitations, or syncope. GASTROINTESTINAL: No nausea, vomiting, abdominal pain, hematemesis, or melena. GENITOURINARY: No dysuria, hematuria, renal calculi or frequency. ENDOCRINE: No polyuria, nocturia, thyroid problems, increased sweating, heat or cold intolerance. MUSCULOSKELETAL: No pain in the neck, back, shoulder, knee. NEUROLOGIC: No numbness, weakness, dysarthria, epilepsy, tremor, vertigo, ataxia. PSYCHIATRIC: No anxiety, insomnia, bipolar disorder, schizophrenia, or nervousness.   PHYSICAL EXAMINATION:  VITAL SIGNS: Pulse 94, respirations 16, blood pressure 111/65, temperature 102.1 rectally.   GENERAL: Alert and oriented, chronically ill-appearing.   HEENT: Pupils equal and reactive to light. Extraocular movements are intact. Oropharynx is moist without any thrush. No mucosal ulcerations on the soft or the hard palate.   NECK: Trachea in the midline. Neck supple. No JVD. No thyromegaly. No lymphadenopathy.   LUNGS: Clear to auscultation bilaterally with normal respiratory effort. No accessory muscle use.   CARDIOVASCULAR: Regular rate and rhythm. No murmurs, rubs, or gallops.   ABDOMEN: Soft, nontender, nondistended. No masses or hepatosplenomegaly.   EXTREMITIES: No peripheral edema, extremity lymphadenopathy.   SKIN: Without any skin ulcerations or subcutaneous nodules.   PSYCHIATRIC: Appropriate mood and affect and oriented to time, place and person.   LABORATORY, RADIOLOGICAL AND DIAGNOSTIC DATA: Lactic acid is 0.7. Urinalysis negative. Troponin less than 0.02, sodium 139, potassium  3.9, chloride 104, bicarbonate 29, total bilirubin  0.3, alkaline phosphatase 77, ALT 28, AST 28, lipase 21. WBC 5.3, hemoglobin 12.0, hematocrit 34.6, platelet count 179.   ASSESSMENT AND PLAN:  1. Left-sided chest pain, unclear etiology. The patient's pain most likely is musculoskeletal. He had a recent extensive work-up including a CT angiogram of the chest which was negative on the 28th. Therefore, this will not be repeated. He is also on Lovenox since his pulmonary embolism in March. Will place him on telemetry. Cycle cardiac enzymes. Consider obtaining a 2-D echo in the morning. He did not complain of any dysphagia and therefore I doubt that this is esophageal in origin.  2. Abdominal pain. He points to the epigastric region. At this time, the patient has normal LFTs as well as normal lipase. I doubt that this is acute pancreatitis. He had a CT scan three months ago that showed changes consistent with his recent surgery. However, there was no evidence of any masses or recurrent edema. The patient could benefit from a repeat CT scan if oncology recommends it.  3. Fever. Given the fact that the patient is immunosuppressed and is on chemotherapy, will start the patient on cefepime and vancomycin.  4. He is a FULL CODE.  TIME SPENT: 70 minutes.   ____________________________ Richarda Overlie, MD na:ap D: 12/19/2011 04:40:28 ET T: 12/19/2011 09:07:52 ET JOB#: [id number]  cc: Richarda Overlie, MD, <Dictator> Richarda Overlie MD ELECTRONICALLY SIGNED 12/27/2011 21:27

## 2014-08-07 NOTE — Discharge Summary (Signed)
PATIENT NAME:  Tyler Love, Tyler Love MR#:  [id number] DATE OF BIRTH:  12/18/1965  DATE OF ADMISSION:  12/19/2011 DATE OF DISCHARGE:  12/19/2011  PRIMARY CARE PHYSICIAN: None.   ONCOLOGIST: Dr.  Sherrlyn HockPandit   PRESENTING COMPLAINT: Chest pain.   DISCHARGE DIAGNOSES:  1. Chest pain, appears musculoskeletal.  2. Stage II-B adenocarcinoma of the head of the pancreas status post Whipple's procedure, [facility].  3. Bilateral pulmonary embolism, on Lovenox and warfarin.  4. History of gout.   CONDITION ON DISCHARGE: Fair.   CODE STATUS: FULL CODE.   MEDICATIONS:  1. Reglan 10 mg 4 times a day.  2. Valium 5 mg every eight hours as needed for anxiety.  3. Norco 325/5 mg 1 to 2 q.6 p.r.n.  4. Venlafaxine 75 mg extended-release 1 p.o. daily.  5. Lovenox home dose as before.  6. MiraLAX 17 grams orally daily.  7. Continue warfarin as prescribed by Dr. Sherrlyn HockPandit.   DIET: Regular.   FOLLOW-UP: Follow-up with Dr. Sherrlyn HockPandit on your scheduled appointment next Wednesday.  LABORATORY, DIAGNOSTIC, AND RADIOLOGICAL DATA: Cardiac enzymes x3 negative. Lactic acid 0.7. One out of two blood cultures positive for gram-positive cocci. Urinalysis negative for urinary tract infection. CBC within normal limits. Lipase 21. Comprehensive metabolic panel within normal limits.   BRIEF SUMMARY OF HOSPITAL COURSE: Tyler Love is a 49 year old African American gentleman with history of stage II-B adenocarcinoma of the head of the pancreas who came to the Emergency Room with complaints of:   1. Left-sided midsternal chest pain. Etiology appears most likely musculoskeletal since chest pain was aggravated on palpation. He had a recent extensive work-up including repeat CT angiography of the chest on August 28th which was negative for PE. He was diagnosed with PE in March of 2013 and has been on Lovenox and warfarin per Dr. Sherrlyn HockPandit which was continued. His cardiac enzymes x3 remained negative.  EKG did not show any acute ST elevation or depression. The patient was recommended to continue p.r.n. Tylenol versus Norco for his chest pain.  2. Abdominal pain, resolved. No nausea or vomiting. The patient tolerated diet prior to discharge.  3. Fever x1, low-grade, in the Emergency Room. No indication for any antibiotics at this time as baseline fever work-up with UA and chest x-ray remained negative. The patient did not have any other symptoms. His blood culture one out of two was positive which appears most likely contamination.   Discharge plan was discussed with the patient and the patient's wife who were agreeable. Hospital stay otherwise remained stable.   TIME SPENT: 40 minutes.   ____________________________ Wylie HailSona A. Allena KatzPatel, MD sap:drc D: 12/20/2011 07:35:40 ET T: 12/22/2011 11:56:49 ET JOB#: [id number]  cc: Reola Buckles A. Allena KatzPatel, MD, <Dictator> Sandeep R. Sherrlyn HockPandit, MD Willow OraSONA A Dietra Stokely MD ELECTRONICALLY SIGNED 12/22/2011 15:46

## 2014-08-07 NOTE — Consult Note (Signed)
History of Present Illness:   Reason for Consult Progressive pancreatic cancer, worsening abdominal pain.    HPI   Patient is a 49 year old male with progressive pancreatic cancer who was last evaluated in clinic on March 29, 2012.  He has not received any treatment for his cancer since August of 2013.  He is currently awaiting clinical trial at [facility] which he states he should hear later this week if he is able to enroll.  Patient typically has chronic abdominal pain, but states over the past several days the pain was "different" and significantly worse to the point where he was unable to tolerate his oral narcotics.  He denies any nausea or vomiting.  He continues to have a fair appetite.  He denies a fevers.  He has no neurologic complaints.  He denies any chest pain or shortness of breath.  Patient states his pain has significantly improved since admission.  He offers no further specific complaints.   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  Social History - Denies smoking, alcohol or recreational drug usage.  Previously worked at [employer], currently on disability.   Review of Systems:   Performance Status (ECOG) 1    Review of Systems   As per HPI. Otherwise, 10 point system review was negative.   NURSING NOTES: **Vital Signs.:   28-Dec-13 13:38    Vital Signs Type: Routine    Temperature Temperature (F): 97.5    Celsius: 36.3    Temperature Source: Oral    Pulse Pulse: 68    Respirations Respirations: 20    Systolic BP Systolic BP: 353    Diastolic  BP (mmHg) Diastolic BP (mmHg): 70    Mean BP: 84    Pulse Ox % Pulse Ox %: 97    Pulse Ox Activity Level: At rest    Oxygen Delivery: Room Air/ 21 %   Physical Exam:   General Thin, no acute distress    Lungs: clear    Cardiac: regular rate, rhythm    Abdomen: soft  nontender  positive bowel sounds    Skin: intact    Extremities: No edema, rash or cyanosis    Neuro: AAOx3    Psych: alert and cooperative    No Known Allergies:     MS Contin 15 mg/12 hr oral tablet, extended release: 1 tab(s) orally every 12 hours, Active, 60, None   Imitrex 25 mg oral tablet: Take 1 tablet orally every 8 hours as needed for Migraine headache, Active, 8, None   oxyCODONE 5 mg oral tablet: 1 tab(s) orally every 6 hours, As Needed, Active, 24, None   Lovenox 100 mg/mL injectable solution: inject 139m (1 milliliters) injectable once a day., Active, 0, None   morphine 15 mg oral tablet: 1 tab(s) orally every 12 hours, Active, 0, None  Laboratory Results: Hepatic:  27-Dec-13 12:05    Bilirubin, Total 0.4   Alkaline Phosphatase 136   SGPT (ALT) 22   SGOT (AST) 29   Total Protein, Serum  9.0   Albumin, Serum 4.2  Routine Chem:  27-Dec-13 12:05    Result Comment WBC - RESULTS  VERIFIED BY REPEAT TESTING.  - NOTIFIED OF CRITICAL VALUE  - DLQ to Vonna Kotyk RN at [phone] on  - [id number].  - READ-BACK PROCESS PERFORMED.  Result(s) reported on 15 Apr 2012 at 12:30PM.   Glucose, Serum 88   BUN 14   Creatinine (comp) 0.89   Sodium, Serum 141   Potassium, Serum 3.6   Chloride, Serum 105   CO2, Serum 28   Calcium (Total), Serum 9.8   Osmolality (calc) 281   eGFR (African American) >60   eGFR (Non-African American) >60 (eGFR values <57m/min/1.73 m2 may be an indication of chronic kidney disease (CKD). Calculated eGFR is useful in patients with stable renal function. The eGFR calculation will not be reliable in acutely ill patients when serum creatinine is changing rapidly. It is  not useful in  patients on dialysis. The eGFR calculation may not be applicable to patients at the low and high extremes of body sizes, pregnant women, and vegetarians.)   Anion Gap 8   Lipase  41 (Result(s) reported on 15 Apr 2012 at 12:30PM.)  Routine UA:  27-Dec-13 12:05    Color (UA) Amber   Clarity (UA) Hazy   Glucose (UA) Negative   Bilirubin (UA) Negative   Ketones (UA) 2+   Specific Gravity (UA) 1.031   Blood (UA) Negative   pH (UA) 5.0   Protein (UA) 30 mg/dL   Nitrite (UA) Negative   Leukocyte Esterase (UA) Negative (Result(s) reported on 15 Apr 2012 at 12:46PM.)   RBC (UA) NONE SEEN   WBC (UA) 2 /HPF   Bacteria (UA) NONE SEEN   Epithelial Cells (UA) NONE SEEN   Mucous (UA) PRESENT   Hyaline Cast (UA) 3 /LPF (Result(s) reported on 15 Apr 2012 at 12:46PM.)  Routine Hem:  27-Dec-13 12:05    Neutrophil % 62.0   Lymphocyte % 16.3   Monocyte % 19.5   Eosinophil % 1.6   Basophil % 0.6   Neutrophil #  1.2   Lymphocyte #  0.3   Monocyte # 0.4   Eosinophil # 0.0   Basophil # 0.0   Reference Accession# [id number](Result(s) reported on 15 Apr 2012 at 05:48PM.)   WBC (CBC)  1.9   RBC (CBC)  4.24   Hemoglobin (CBC) 13.3   Hematocrit (CBC)  39.5   Platelet Count (CBC) 244   MCV 93   MCH 31.5   MCHC 33.8   RDW 12.1   Assessment and Plan:  Impression:   Progressive pancreatic cancer, worsening abdominal pain.  Plan:   1.  Pancreatic cancer: No intervention needed at this time. [facility] Abdominal pain:  Possibly typhlitis.  Improving with IV Dilaudid and Zofran. Continue current treatment and transition to oral narcotics when patient able to tolerate. Disposition: Patient will likely remain in the hospital for 1-2 days until his pain is better controlled.  He has been instructed [facility] Dr. PMa Hillockconsult, call with questions.  Electronic  Signatures: FDelight Hoh(MD)  (Signed 28-Dec-13 14:48)  Authored: HISTORY OF PRESENT ILLNESS, PFSH, ROS, NURSING NOTES, PE, ALLERGIES, HOME MEDICATIONS, LABS, ASSESSMENT AND PLAN   Last Updated: 28-Dec-13 14:48 by FDelight Hoh(MD)

## 2014-08-07 NOTE — H&P (Signed)
PATIENT NAME:  Tyler Love, Tyler Love MR#:  [id number] DATE OF BIRTH:  27-Jul-1965  DATE OF ADMISSION:  12/21/2011  PRIMARY CARE PHYSICIAN: The patient reports he has none.  PRTrixie RudeMARY ONCOLOGIST: Janese BanksSandeep Pandit, MD  CHIEF COMPLAINT: The patient was called by the emergency department for positive blood cultures.   HISTORY OF PRESENT ILLNESS: This is a 49 year old male with significant past medical history of stage II-B pancreatic adenocarcinoma status post Whipple procedure in February of this year at [facility] and currently receiving chemotherapy and radiation treatment at [facility] supervised by Dr. Janese BanksSandeep Pandit. The patient was admitted 07/19/2011 for complaints of chest pain where it was ruled out. The patient was discharged home. The patient had blood cultures sent upon admission, on 12/19/2011, and one aerobic bottle is growing gram positive cocci so the patient was called to the emergency department for further evaluation. The patient denies any fever or chills, rigors, dysuria, polyuria, cough, or productive sputum. The patient's temperature was 99 in the emergency department. The patient complains of decreased p.o. oral intake and occasional fatigue and he reports this usually happens when he gets his chemotherapy and radiation. The patient's repeat set of blood cultures were sent from the ED. As well he received vancomycin and Rocephin. The hospitalist service was requested to admit the patient for further management of his abnormal blood cultures.   PAST MEDICAL HISTORY:  1. Bilateral pulmonary emboli. 2. Chronic or vertigo.  3. Gout.  4. Stage II-B pancreatic adenocarcinoma status [facility], currently receiving chemotherapy and radiation therapy at [facility].   FAMILY HISTORY: Sister has breast cancer in her 3940s, otherwise unremarkable.   SOCIAL HISTORY: He denies any smoking, alcohol, or recreational drug use.   ALLERGIES: No known drug allergies.    HOME MEDICATIONS:  1. Valium 5 mg every eight hour as needed.  2. Vicodin 5/325 mg one to two tablets every six hours as needed.  3. Venlafaxine extended release 75 mg daily.  4. Lovenox 100 mg subcutaneous daily.  5. MiraLax 17 grams oral daily as needed.  6. Reglan 10 mg 1 tablet every four hours as needed.   REVIEW OF SYSTEMS: CONSTITUTIONAL: The patient denies any fever. Complains of general fatigue and weakness. EYES: Denies blurry vision, double vision, or pain. ENT: Denies tinnitus, ear pain, or hearing loss. RESPIRATORY: Denies any cough, wheezing, hemoptysis, or dyspnea. CARDIOVASCULAR: Denies chest pain, orthopnea, edema, arrhythmia, or palpitations. GASTROINTESTINAL: Denies any nausea, vomiting, diarrhea, abdominal pain, hematemesis, gastroesophageal reflux disease, or jaundice. Complains of poor appetite. GU: Denies dysuria, hematuria, or renal colic. ENDO: Denies polyuria, polydipsia, heat or cold intolerance. HEMATOLOGY: Denies any anemia or easy bruising. Has history of bilateral PE. INTEGUMENTARY: Denies any acne or rash. MUSCULOSKELETAL: Denies any neck pain, shoulder pain, knee pain, swelling, or gout. NEURO: Denies any numbness, weakness, dysarthria, epilepsy, tremors, or vertigo. PSYCH: Has depression. Denies any substance abuse or alcohol abuse. Denies any insomnia.   PHYSICAL EXAMINATION:   VITAL SIGNS: Temperature 99, pulse 117, respiratory rate 20, blood pressure 122/73, and saturating 97% on room air.   GENERAL: Malnourished, cachectic male who lies comfortable in bed, in no apparent distress.   HEENT: Head atraumatic, normocephalic. Pupils are equally round and reactive to light. Pink conjunctivae. Pink conjunctivae. Anicteric sclerae. Moist oral mucosa.   NECK: Supple. No thyromegaly. No JVD.   CHEST: Good air entry bilaterally. No wheezing, rales, or rhonchi. Port-A-Cath appears to be on the right upper chest area. Site looks  clean, no erythema or discharge.    CARDIOVASCULAR: S1 and S2 heard. No rubs, murmurs, or gallops. Tachycardic.   ABDOMEN: Soft, nontender, and nondistended. Bowel sounds present.   EXTREMITIES: No edema. No clubbing and no cyanosis.   PSYCHIATRIC: Appropriate affect, awake and alert x3. Intact judgment and insight.  NEURO: Cranial nerves grossly intact. Motor five out of five in all extremities.   SKIN: Normal skin turgor. Warm and dry.   PERTINENT RESULTS: Glucose 86, BUN 10, creatinine 0.62, sodium 139, potassium 3.4, chloride 106, and CO2 27. White blood cell 1.8, hemoglobin 11.7, hematocrit 33.2, and platelets 164.   ASSESSMENT AND PLAN:  1. Positive blood cultures/bacteremia. At this point it is unclear if it is contamination versus true bacteremia as so far only one bottle is growing gram positive cocci in clusters, but given the fact the patient is currently receiving chemotherapy and radiation and has low white blood cell count, even though he is not neutropenic, we will admit the patient. We will start him on IV vancomycin and Rocephin until his blood cultures are final so we can clarify if it is contamination or not. Two sets of blood cultures were sent in the ED and, if it is contamination, we will try to discharge the patient so he can continue his radiation therapy next Tuesday and chemotherapy on Wednesday.  2. History of bilateral PE. We will continue the patient on subcutaneous Lovenox 100 mg subcutaneous oral daily.  3. Pancreatic cancer status post Whipple surgery. Currently receiving radiation and chemotherapy. 4. Hypokalemia, replaced.  5. Tachycardia. The patient appears to be clinically volume depleted. We will continue with IV normal saline at 25 mL per hour.           CODE STATUS: FULL CODE.   TOTAL TIME SPENT ON ADMISSION AND PATIENT CARE: 45 minutes.  ____________________________ Starleen Arms, MD dse:slb D: 12/21/2011 04:53:36 ET T: 12/21/2011 14:05:50  ET JOB#: [id number]  cc: Starleen Arms, MD, <Dictator> Arista Kettlewell Teena Irani MD ELECTRONICALLY SIGNED 12/22/2011 0:28

## 2014-08-07 NOTE — H&P (Signed)
PATIENT NAME:  Tyler Love, Tyler Love MR#:  [id number] DATE OF BIRTH:  1965/12/07  DATE OF ADMISSION:  04/15/2012  PRIMARY CARE PHYSICIAN: Sandeep R. Sherrlyn HockPandit, MD.   CHIEF COMPLAINT: Abdominal pain.   HISTORY OF PRESENT ILLNESS: This is a 49 year old male who presents to the Emergency Room due to abdominal pain, nausea and vomiting. The patient has a history of pancreatic cancer and is status post chemoradiation and normally follows up with Dr. Sherrlyn HockPandit.  Over the past two days, the patient says that his pain has gotten progressively worse. The pain is located diffusely on the right side of the abdomen. He also says that he has not been able to eat or drink for the past day or so. His vomiting has been consistent with bilious in nature. He denies any fevers or chills. He denies any chest pain, shortness of breath. He denies any other associated symptoms. The patient is on some narcotics at home for pain control, although since he has not been able to keep his pills down, his pain has gotten worse. He was, therefore, brought to the ER for further evaluation.   REVIEW OF SYSTEMS: CONSTITUTIONAL: No documented fever. No weight gain or weight loss.  EYES: No blurry or double vision.  HEENT: No tinnitus. No postnasal drip. No redness of the oropharynx.  RESPIRATORY: No cough, no wheeze, no hemoptysis, no dyspnea.  CARDIOVASCULAR: No chest pain, no orthopnea, no palpitations, no syncope.  GI: Positive nausea. Positive vomiting. Positive abdominal pain. No melena, no hematochezia.  GU: No dysuria or hematuria.  ENDOCRINE: No polyuria or nocturia. No heat or nitrate heat or cold intolerance.  HEMATOLOGIC: No anemia, no bruising, no bleeding.  INTEGUMENTARY: No rashes. No lesions.  MUSCULOSKELETAL: No arthritis, no swelling, no gout.  NEUROLOGIC: No numbness, no tingling, no ataxia, no seizure-type activity.  PSYCH: No anxiety, no insomnia, no ADD.   PAST MEDICAL HISTORY:  1.  Pancreatic cancer.  2.  History  of pulmonary embolism.  3.  History of migraines.  4.  Chronic leukopenia and neutropenia.   ALLERGIES: No known drug allergies.   SOCIAL HISTORY: No smoking. No alcohol abuse. No illicit drug abuse. Lives at home by himself.   FAMILY HISTORY: The patient's father died from complications of heart disease. His mother is alive, has asthma. He has a sister with breast cancer.   CURRENT MEDICATIONS: Imitrex 25 mg, as needed for migraine headache; Lovenox 100 mg subcutaneous daily; MS Contin 15 mg b.i.d.; oxycodone 5 mg q.6 hours, as needed.   PHYSICAL EXAMINATION ON ADMISSION: VITAL SIGNS: Temperature 97.8, pulse 97, respirations 20, blood pressure 128, sats 9% on room air.  GENERAL: The patient is a pleasant appearing male in no apparent distress.  HEENT: Atraumatic, normocephalic. Extraocular muscles are intact. Pupils equal, reactive to light. Sclerae anicteric. No conjunctival injection. No pharyngeal erythema.  NECK: Supple. No jugulovenous distention, no bruits, no lymphadenopathy or thyromegaly.  HEART: Regular rate and rhythm. No murmurs, rubs, or clicks.  LUNGS: Clear to auscultation, bilaterally. No rales or rhonchi, no wheezes.  ABDOMEN: Soft, tender in the right upper quadrant/epigastrium, no rebound or rigidity. Good bowel sounds. No hepatosplenomegaly appreciated.  EXTREMITIES: No evidence of any cyanosis, clubbing, or peripheral edema. Has +2 pedal and radial pulses, bilaterally.  NEUROLOGIC: The patient is alert, awake and oriented x 3 with no focal motor or sensory deficits appreciated, bilaterally.  SKIN: Moist and warm with no rash appreciated.  LYMPHATIC: There is evidence of cervical or axillary lymphadenopathy.  DIAGNOSTIC DATA:  Serum glucose 88, BUN 14, creatinine 0.8, sodium 141, potassium 3.6, chloride 105, bicarbonate 28. Lipase 41. LFTs within normal limits. White cell count 1.9, hemoglobin 13.3, hematocrit 39.5, platelet count 244. Urinalysis within normal limits.    The patient did have a CT scan of the abdomen and pelvis done with contrast, which showed bowel wall thickening in the ascending colon with mild adjacent inflammatory changes. Also, body of the appendix seems to be mildly enlarged, however, remainder of the appendix is air filled.  Clinical correlation of appendicitis is suggested. Focal mild bowel wall thickening of loop of the small bowel in the right hemiabdomen.  May be infectious or inflammatory.  Malignancy is not excluded. Multiple small defined foci of hypoattenuation in the liver.  These are more conspicuous than prior studies, concerning for metastatic disease given the patient's history.   ASSESSMENT AND PLAN: This is a 49 year old male with history of pancreatic cancer, status post chemoradiation, history of pulmonary embolism, history of migraines and chronic pain, who presents to the hospital due to worsening abdominal pain, nausea and vomiting.  1.  Abdominal pain, nausea and vomiting. This is likely related to his underlying malignancy. His CT of the abdomen and pelvis did show nonspecific findings, but no evidence of any acute surgical pathology. He is afebrile. He does not have elevated white cell count. I do not think that he has appendicitis. He is not having any diarrhea, so unlikely has any colitis. His pain has been uncontrolled, because he has not been able to keep his p.o. meds down, therefore, I will admit him and start him on IV Dilaudid, also give him supportive care with IV fluids, antiemetics and follow him clinically.  2.  History of pancreatic cancer, status post chemoradiation. The patient now has been referred to [facility], to be put on a therapeutic trial, as his cancer has recurred. He is followed by Dr. Sherrlyn Hock. I will go ahead and consult Oncology for now.  3.  History of pulmonary embolism. I will continue his Lovenox. 4.  Leukopenia. This seems to be chronic in nature, probably related to his underlying malignancy with  chemoradiation. He has no other acute infectious source presently. I will await further Oncology input to see if he would benefit from Neupogen.   CODE STATUS: The patient is a full code.   Time Spent with the admission: 45 minutes.  ____________________________ Rolly Pancake. Cherlynn Kaiser, MD vjs:eg D: 04/15/2012 17:48:08 ET T: 04/15/2012 19:54:41 ET JOB#: [id number]  cc: Rolly Pancake. Cherlynn Kaiser, MD, <Dictator> Houston Siren MD ELECTRONICALLY SIGNED 04/18/2012 8:21

## 2014-08-07 NOTE — Discharge Summary (Signed)
PATIENT NAME:  Tyler Love, Harshal L MR#:  [id number] DATE OF BIRTH:  1965/06/29  DATE OF ADMISSION:  12/21/2011 DATE OF DISCHARGE:  12/22/2011  PRIMARY ONCOLOGIST: Dr. Janese BanksSandeep Pandit   DISCHARGE DIAGNOSES:  1. Pancreatic adenocarcinoma on radiation and chemotherapy.  2. Contaminated blood cultures with no bacteremia.  3. History of pulmonary embolism.  4. Mild neutropenia and leukopenia.  5. Anemia of chronic disease.   LABORATORY, DIAGNOSTIC AND RADIOLOGICAL DATA: Abdomen and chest x-ray which showed no acute abnormalities.   ADMITTING HISTORY AND PHYSICAL: Please see detailed history and physical dictated on 12/21/2011. In brief, 49 year old male patient with history of pancreatic adenocarcinoma on radiation and chemotherapy presented to the Emergency Room after being called from the hospital regarding positive blood culture he had. Patient did not have any concerns, was admitted for IV antibiotics. Patient had repeat blood cultures drawn in the Emergency Room which are negative to date. His initial blood cultures which are positive were one of two bottles with coagulase negative staph which is being thought to be contaminant as patient is afebrile, has no source of infection and is being discharged home to follow up with his oncologist.   Patient's Lovenox from home was continued during his hospital stay with history of pulmonary embolism. Patient also had radiation therapy for his pancreatic adenocarcinoma during the hospital stay.   On the day of discharge patient's temperature is 98.5, pulse 72, blood pressure 115/68, saturating 98% on room air. Normal lung and cardiac examination with port on his chest with no erythema, swelling or redness and is being discharged home in a stable condition.   DISCHARGE MEDICATIONS:  1. Lovenox 1 mg/kg twice a day.  2. Venlafaxine 75 mg, 1 tablet oral once a day.  3. MiraLax 17 grams orally 3 times a day as needed for constipation.  4. Norco 325/5 mg orally  1 to 2 tablets every six hours as needed for pain.  5. Valium 5 mg 1 tablet every eight hours as needed for anxiety.   DISCHARGE INSTRUCTIONS: Follow with oncologist within 3 to 4 days for leukopenia and further treatment of the cancer. Patient will be on regular diet. Activity as tolerated. Patient has to return to Emergency Room if he has any further fever or deterioration of his symptoms. This plan was discussed with the patient and his wife at bedside who verbalized understanding and are okay with the plan.   TIME SPENT: Time spent on discharge dictation along with coordinating care and counseling of the patient and family was 40 minutes.  ____________________________ Molinda BailiffSrikar R. Ganesh Deeg, MD srs:cms D: 12/22/2011 12:43:31 ET T: 12/23/2011 10:56:48 ET JOB#: [id number]  cc: Wardell HeathSrikar R. Treniyah Lynn, MD, <Dictator> Sandeep R. Sherrlyn HockPandit, MD Orie FishermanSRIKAR R Leahna Hewson MD ELECTRONICALLY SIGNED 12/23/2011 17:55

## 2014-08-07 NOTE — Consult Note (Signed)
Reason for Visit: This 49 year old Male patient presents to the clinic for initial evaluation of  Cancer .   Referred by Dr. Sherrlyn HockPandit.  Diagnosis:   Chief Complaint/Diagnosis   49 year old male status post Whipplefor moderately differentiated adenocarcinoma of the pancreas pathologic stage IIb (T3, N1, M0)   Pathology Report Pathology report reviewed    Imaging Report CT scans and PET CT scan reviewed    Referral Report Clinical notes reviewed    Planned Treatment Regimen Adjuvant chemotherapy and radiation therapy    HPI   patient is a 49 year old male who presented with increasing abdominal pain over several months duration. He was found on CT scan to have a mass in the head of the pancreas. [facility]. Tumor was a 2.8 cm moderately differentiated adenocarcinoma of the pancreatic head. Duodenal wall and peripancreatic soft tissue were involved with tumor. There was lymphovascular and perineural invasion. 3 of 15 regional nodes were involved with metastatic disease with some extracapsular spread. [facility]. His gemcitabine started in May of 2013. He does have a history of pulmonary embolism on 07/10/11 a continues on Lovenox anticoagulation therapy. He recently has had increased abdominal pain. He also has a rising CEA 19-9. Repeat PET/CT scan showedincreased avid uptake of borderline consistency in the region of the pancreatic head. He is now seen for induration of adjuvant radiation therapy with 5-FU radiation sensitizing chemotherapy. Dr. Sherr[facility]Pandit [facility] and [facility] are in agreement with that treatment plan.  Past Hx:    Cancer, Pancreatic: Apr 2013   Pulmonary Embolus: Lovenox 100mg  sq started on 07/24/2011 for 30 days post PE.   Gout: right shoulder   Vertigo:    Ulcers, Gastric:    Cancer  Surgery: Removal of pancreatic tumor, part of stomach, and pancreas [facility] Apr 2013   None, patient reports no surgical history.:   Past, Family and Social History:   Past Medical History positive    Gastrointestinal GERD; peptic ulcer disease    Past Medical History Comments Gout, pulmonary embolus, vertigo    Family History positive    Family History Comments Sister with breast cancer    Social History noncontributory    Additional Past Medical and Surgical History Accompanied by family member today   Allergies:   No Known Allergies:   Home Meds:  Home Medications: Medication Instructions Status  Norco 325 mg-5 mg oral tablet 1 - 2 tablets orally every 6 hours as needed for pain Active  venlafaxine 75 mg oral tablet, extended release 1 tab(s) orally once a day Active  Valium 5 mg tablet 1 tab(s) orally every 8 hours Active  Megace 40 mg/mL suspension 10 mL (400 mg) orally once a day Active  Lovenox 100 mg/mL injectable solution 1 dose(s) injectable once a day for 30 days Active  ferrous sulfate 1  orally  every other day Active  polyethylene glycol 3350 17 gram(s) orally 3 times a day, As Needed- for Constipation  Active   Review of Systems:   General negative    Performance Status (ECOG) 0    Skin negative    Breast negative    Ophthalmologic negative    ENMT negative    Respiratory and Thorax negative    Cardiovascular see HPI    Gastrointestinal see HPI    Genitourinary negative  Musculoskeletal negative    Neurological negative    Psychiatric negative    Hematology/Lymphatics see HPI    Endocrine negative    Allergic/Immunologic negative   Physical Exam:  General/Skin/HEENT:   General normal    Skin normal    Eyes normal    ENMT normal    Head and Neck normal    Additional PE Well-developed slightly thin tactic male in NAD. No supraclavicular adenopathy is appreciated. Lungs are clear to A&P cardiac examination shows  regular rate and rhythm. Abdomen is somewhat tense. There is pain in the mid epigastric region. Positive bowel sounds all 4 quadrants. No peripheral edema in lower extremities is noted.   Breasts/Resp/CV/GI/GU:   Respiratory and Thorax normal    Cardiovascular normal    Gastrointestinal normal    Genitourinary normal   MS/Neuro/Psych/Lymph:   Musculoskeletal normal    Neurological normal    Lymphatics normal   Assessment and Plan:  Impression:   pathologic stage IIB adenocarcinoma of the head of the pancreas with rising CEA 19-9 and PET/CT evidence of recurrent disease in the pancreatic bed  Plan:   at this time I recommend adjuvant radiation therapy along with chemotherapy. Would like to use IMRT treatment planning and delivery to spare normal bowel kidneys liver and spinal cord. Would plan on delivering up to 5400 cGy over 5 weeks. Risks and benefits of treatment including nausea, diarrhea, fatigue, alteration of blood counts, and possible skin reaction were all discussed in detail with the patient and his family member. I have set him up for CT simulation early next week. Case was reviewed and discussed with Dr. Sherrlyn Hock. We will coordinate 5-FU chemotherapy with Dr. Sherrlyn Hock steam and coordinate the start of treatments concurrently.  I would like to take this opportunity to thank you for allowing me to continue to participate in this patient's care.  CC Referral:   cc: Dr. Lou Cal McRee   Electronic Signatures: Rebeca Alert (MD)  (Signed 17-Jul-13 15:49)  Authored: HPI, Diagnosis, Past Hx, PFSH, Allergies, Home Meds, ROS, Physical Exam, Encounter Assessment and Plan, CC Referring Physician   Last Updated: 17-Jul-13 15:49 by Rebeca Alert (MD)

## 2014-08-10 NOTE — Consult Note (Signed)
PATIENT NAME:  Tyler Love, Tyler Love MR#:  [id number] DATE OF BIRTH:  [birth year]  DATE OF CONSULTATION:  05/03/2012  REFERRING PHYSICIAN:  Katharina Caperima Vaickute, MD.  CONSULTING PHYSICIAN:  Nadalee Neiswender R. Sherrlyn HockPandit, MD  REASON FOR CONSULTATION: Pancreatic cancer, recurrent abdominal pain, nausea/vomiting.   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  The patient recently received cycle 1 second line chemotherapy with FOLFOX regimen about one week ago.  He was just discharged from hospital after admission for nausea, vomiting, abdominal pain.  He returned to Emergency Room with recurrence of these symptoms, he is unable to keep food down.  Abdominal pain is mostly in the epigastric and right upper quadrant area, he has not been able to keep long-acting morphine tablets down, currently has been started on fentanyl patch 25 mcg daily along with Dilaudid as needed.  States that pain is better controlled at this time.  He is not vomiting at the present time and is trying to eat.  He does have intermittent nausea.  Otherwise, generalized weakness the same, ambulates minimally and slowly.  Denies any bleeding symptoms.  He is on Lovenox for history of PE.   PAST MEDICAL HISTORY AND PAST SURGICAL HISTORY:   1.  Bilateral PE March 2013. 2.  Stage IV pancreatic cancer status post [facility]  3.  Chronic vertigo.  4.  History of gout.  5.  Echocardiogram March 2013 reported severe right ventricular dysfunction.   FAMILY HISTORY:  Remarkable for breast cancer in a sister.   SOCIAL HISTORY:  Denies smoking, alcohol or recreational drug usage.  Ambulates slowly.   ALLERGIES:  No known drug allergies.   HOME MEDICATIONS:  MS Contin 15 mg q. 12 hours, Lovenox 100 mg subcutaneously daily, Imitrex as needed for migraine headaches, Zofran 4 mg 3 times a day as needed for nausea, Marinol 2.5 mg by  mouth twice daily for appetite stimulation.  REVIEW OF SYSTEMS: CONSTITUTIONAL:  Generalized weakness.  Denies any fever or chills.  HEENT:  Denies any headaches, dizziness, epistaxis, ear or jaw pain.  CARDIAC:  No chest pain, palpitations, orthopnea.  No PND.  LUNGS:  Has mild dyspnea on exertion, no dyspnea at rest or cough, sputum or hemoptysis.  GASTROINTESTINAL:  As in history of present illness.  No diarrhea or blood in stools.  GENITOURINARY:  No dysuria or hematuria.  HEMATOLOGIC:  Denies obvious bleeding symptoms.  SKIN:  No new rashes.  MUSCULOSKELETAL:  Denies new bone pains.   EXTREMITIES:  No new swelling or pain.  NEUROLOGIC:  No new focal weakness, loss of consciousness or paresthesias in extremities.  ENDOCRINE:  No polyuria or polydipsia.   PHYSICAL EXAMINATION: GENERAL:  Patient is chronically weak and tired-looking, sitting in bed, otherwise alert and oriented and converses appropriately.  No acute distress.  No icterus.  VITAL SIGNS:  98.3, 89, 20, 106/72, 97% on room air.  HEENT:  Normocephalic, atraumatic.  Extraocular movements intact.  Sclerae anicteric.  No oral thrush.  NECK:  Negative for lymphadenopathy. CARDIOVASCULAR:  S1, S2, regular rate and rhythm.  LUNGS:  Show bilateral good air entry, no crepitations or rhonchi.  ABDOMEN:  Soft, nondistended, tenderness present in the epigastrium and right upper quadrant.  Bowel sounds present.  EXTREMITIES:  No major edema or cyanosis.  NEUROLOGIC:  Limited exam, cranial nerves intact, moves all extremities spontaneously.  SKIN:  No generalized rashes or  major bruising.   LABORATORY, DIAGNOSTIC AND RADIOLOGICAL DATA:  WBC 1800, ANC 1200, hemoglobin 11.1, platelets 203.  Creatinine 0.71, calcium 8.6.  Liver functions unremarkable except AST of 49 and albumin of 3.1, creatinine 0.71.  Chest x-ray negative for acute cardiopulmonary abnormality.  KUB x-ray showed nonspecific bowel gas pattern.   IMPRESSION AND  RECOMMENDATIONS:  The patient is a 49 year old gentleman with known history of stage IV metastatic pancreatic cancer with liver and abdominal metastasis, recently started on cycle 1 palliative chemotherapy with FOLFOX regimen about 1 week ago, received Neulasta after chemotherapy was done.  CBC at this time shows mild neutropenia, he remains afebrile.  Currently admitted with vomiting and abdominal pain, patient has had recurrent hospitalizations for these symptoms.  He feels better today, unable to keep MS Contin down, agree with fentanyl 25 mcg patch,  continue to gradually escalate dose as needed for pain control.  Continue other supportive care including IV fluids and symptomatic treatment.  The patient explained that he has just started on FOLFOX chemotherapy and that he would need to complete at least 3 to 4 cycles in order to assess for response to treatment, but if his general condition declines in the meantime, then we may need to discontinue chemotherapy and consider hospice.  The patient at this time states that he is not yet ready for hospice, and wants to try and keep taking chemotherapy.  He is otherwise DO NOT RESUSCITATE.  Will continue to follow intermittently, if he has severe pain issues recommend changing over to PCA until pain comes under control and then switch back to fentanyl plus oral breakthrough medication.  The patient is agreeable to this plan.   Thank you for the referral.  Please feel free to contact me if any additional questions.       ____________________________ Maren Reamer Sherrlyn Hock, MD srp:ea D: 05/04/2012 00:13:00 ET T: 05/04/2012 01:04:53 ET JOB#: [id number]  cc: Jaclyn Andy R. Sherrlyn Hock, MD, <Dictator> Wille Celeste MD ELECTRONICALLY SIGNED 05/04/2012 9:57

## 2014-08-10 NOTE — Discharge Summary (Signed)
PATIENT NAME:  Tyler Love, [name] MR#:  [id number] DATE OF BIRTH:  04/08/1966  DATE OF ADMISSION:  05/05/2012 DATE OF DISCHARGE:  05/07/2012  ADMITTING DIAGNOSIS:  Abdominal pain.   DISCHARGE DIAGNOSES:   1.  Severe abdominal pain related to pancreatic cancer.  2.  Stage IV pancreatic cancer.  3.  Nausea and vomiting felt to be due to pancreatic malignancy. No evidence of bowel obstruction.  4.  Recent diagnosis of  typhilitis 5.  History of pulmonary embolism.  6.  History of migraines.  7.  Failure to thrive due to underlying malignancy.  8.  History of gout.  9.  History of vertigo.  10.  History of gastric ulcer.  11.  Status post Port-A-Cath placement.  12.  Leukopenia.   CONSULTANTS:  Palliative care, Dr. Sherrlyn HockPandit.   PERTINENT LABORATORY AND EVALUATIONS:  Admitting lipase was 41, glucose was 88, BUN 19, creatinine 0.92, sodium 138, potassium 3.7, chloride 103, CO2 26. LFTs were normal except albumin of 8.5. WBC count was 1.6, hemoglobin 14.1, platelet count 276. EKG was normal sinus rhythm without any ST-T wave changes. KUB shows bowel gas pattern nonspecific. CBC on January 17th was 2.4, hemoglobin 10.1, platelet count 153.   HOSPITAL COURSE:  Please refer to history and physical done by the admitting physician. The patient is a 49 year old African American male with past medical history significant for stage IV pancreatic cancer with liver mets as well as metastatic implants in the abdominal wall and distal stomach as well as ascending colon who has recently been hospitalized with an episode of nausea and vomiting. The patient was admitted from 04/15/2012 and discharged on 04/18/2012 and readmitted on 04/28/2012 to 04/30/2012 and then readmitted again on 05/03/2011. The patient came in with severe abdominal pain with associated nausea and vomiting. The patient was admitted for pain control. The patient continued to have a significant amount of pain. Palliative care followed the patient  throughout the hospitalization and his pain improved with the current regimen and his requirement for IV pain medication was decreased. The patient also had leukopenia that also improved as the hospitalization progressed.   DISCHARGE INSTRUCTIONS:  Activity as tolerated.   DIET:  As tolerated.   DISCHARGE MEDICATIONS:  Zofran 4 mg q. 4 hours p.r.n. for nausea, dronabinol 2.5 mg 1 tab p.o. b.i.d., potassium chloride 20 mEq daily, famotidine 20 mg 1 tab p.o. b.i.d., Imitrex 25 mg 1 tab p.o. every 8 hours p.r.n. for migraine, Lovenox 100 mg injectable solution daily, oxycodone 5 mg per 5 mL solution q. 4 hours p.r.n. pain, Tylenol 650 q. 6 hours p.r.n. for pain, Ambien 5 mg 1 tab p.o. at bedtime as needed, Duragesic 75 mcg changed every 72 hours.   REFERRAL:  home health nurse with [facility].   ACTIVITY:  As tolerated.   FOLLOWUP:  With Dr. Sherrlyn HockPandit in 1 to 2 weeks.   CODE STATUS:  DNR which was made during this hospitalization.    TIME SPENT:  35 minutes.    ____________________________ Lacie ScottsShreyang H. Allena KatzPatel, MD shp:si D: 05/07/2012 17:18:00 ET T: 05/08/2012 22:29:43 ET JOB#: [id number]  cc: Jamiria Langill H. Allena KatzPatel, MD, <Dictator> Charise CarwinSHREYANG H Nicholai Willette MD ELECTRONICALLY SIGNED 05/11/2012 11:21

## 2014-08-10 NOTE — H&P (Signed)
PATIENT NAME:  Tyler Love, Banner L MR#:  [id number] DATE OF BIRTH:  [birth year]  DATE OF ADMISSION:  04/28/2012  PRIMARY CARE PHYSICIAN:  Dr. Sherrlyn HockPandit.  CHIEF COMPLAINT:  Abdominal pain, nausea and vomiting.   HISTORY OF PRESENT ILLNESS:  This is a 49 year old male who presents to the Emergency Room due to increasing abdominal pain, nausea, vomiting ongoing for the past 2 days. The patient was just recently discharged from the hospital about 10 days ago when he presented with similar symptoms of abdominal pain, nausea, vomiting, and was diagnosed with typhlitis and discharged on p.o. antibiotics. He was doing fine until he received chemotherapy on Wednesday, and shortly after chemotherapy, he got worse when he had no energy and started developing worsening nausea and vomiting along with abdominal pain. He went to see his oncologist, Dr. Sherrlyn HockPandit, [facility] who then referred him to the ER for further evaluation. In the Emergency Room, the patient underwent a CT scan of the abdomen and pelvis with contrast, which was suggestive of early small bowel obstruction versus ileus. Hospitalist services were contacted for further treatment and evaluation.   REVIEW OF SYSTEMS:  CONSTITUTIONAL:  No documented fever. No weight gain, no weight loss.  EYES:  No blurry or double vision.  ENT:  No tinnitus. No postnasal drip. No redness of the oropharynx.  RESPIRATORY:  No cough, no wheeze, no hemoptysis, no dyspnea.  CARDIOVASCULAR:  No chest pain, no orthopnea, no palpitations, no syncope.  GASTROINTESTINAL:  Positive nausea. Positive vomiting. Positive generalized abdominal pain. No melena, no hematochezia.  GENITOURINARY:  No dysuria or hematuria.  ENDOCRINE:  No polyuria or nocturia. No heat or cold intolerance.   HEMATOLOGY:  No anemia, no bruising, no bleeding.  INTEGUMENTARY:  No rashes. No lesions.  MUSCULOSKELETAL:  No arthritis, no swelling, no gout.  NEUROLOGIC:  No numbness, no tingling, no  ataxia, no seizure-type activity.  PSYCHIATRIC:  No anxiety, no insomnia, no ADD, no depression.   PAST MEDICAL HISTORY:  Consistent with stage IV pancreatic cancer, history of pulmonary embolism, history of migraines, history of chronic leukopenia and neutropenia secondary to chemotherapy.   ALLERGIES:  No known drug allergies.   SOCIAL HISTORY:  No smoking. No alcohol abuse. No illicit drug abuse. Lives at home with his wife.   FAMILY HISTORY:  Father died from complications of heart disease. His mother is alive, has asthma. Has a sister with breast cancer.   CURRENT MEDICATIONS:  Are as follows:  Dronabinol 2.5 mg 1 cap twice a day, famotidine 20 mg b.i.d., Imitrex 25 mg q.8 hours as needed, Lovenox 100 mg injectable subQ daily, MS Contin 15 mg b.i.d., oxycodone 5 mg q.6 hours as needed for breakthrough pain, potassium 12 mEq daily and Zofran 4 mg q.4 hours as needed for nausea and vomiting.   PHYSICAL EXAMINATION: Presently is as follows:  VITAL SIGNS:  Are noted to be temperature 97.2, pulse 78, respirations 14, blood pressure 130/89, sats 99% on room air.  GENERAL:  He is a pleasant-appearing male, in no apparent distress.  HEAD, EYES, EARS, NOSE, THOAT:  He is atraumatic, normocephalic. Extraocular muscles are intact. Pupils equal and reactive to light. Sclerae anicteric. No conjunctival injection. No pharyngeal erythema.  NECK:  Supple. There is no jugular venous distention. No bruits, no lymphadenopathy, no thyromegaly.  HEART:  Regular rate and rhythm. No murmurs, no rubs, no clicks.  LUNGS:  Clear to auscultation bilaterally. No rales, no rhonchi, no wheezes.  ABDOMEN:  Soft,  flat, tender diffusely, more in the epigastric though. No rebound, no rigidity. Hypoactive bowel sounds. No hepatosplenomegaly appreciated.  EXTREMITIES:  No evidence of any cyanosis, clubbing or peripheral edema. Has +2 pedal and radial pulses bilaterally.  NEUROLOGICAL:  The patient is alert, awake and  oriented x 3 with no focal motor or sensory deficits appreciated bilaterally.  SKIN:  Moist and warm with no rash appreciated.  LYMPHATIC:  There is no cervical or axillary lymphadenopathy.   LABORATORY, DIAGNOSTIC AND RADIOLOGICAL DATA: Shows a glucose 85, BUN 15, creatinine 0.8, sodium 142, potassium 3.7, chloride 107, bicarbonate 27. LFTs are within normal limits. Troponin less than 0.02. White cell count 2.6, hemoglobin 12.5, hematocrit 36.1, platelet count of 280.   The patient did have a CT scan of the abdomen and pelvis done with contrast, which showed regions concerning for metastatic implants along the anterior abdominal wall, as well as the gastric lumen distally and involving the ascending colon, also findings concerning for an early or partial small bowel obstruction versus a reactive ileus. Finding representing infiltrative disease of the loops of small bowel wall versus inflammatory changes.  Vessel findings concerning for metastatic disease to the liver.   ASSESSMENT AND PLAN: This is a 49 year old male with a history of stage IV pancreatic cancer, chronic pain due to malignancy, chronic leukopenia, history of migraines, history of pulmonary embolism, presents to the hospital due to abdominal pain, nausea, vomiting, and CT scan findings suggestive of partial small bowel obstruction and ileus.  1.  Ileus/partial small bowel obstruction. I think this is likely related to his underlying malignancy. He is likely not a great surgical candidate at this time, but I will go ahead and get a surgical consult to see their opinion. Continue conservative management with bowel rest, IV fluids, pain control and antiemetics. His abdomen does not really appear to be significantly distended; therefore, we will hold off on placing an NG tube for now.  2.  Stage IV pancreatic cancer. The patient has a very poor prognosis. He has apparently been referred to [facility] for a trial medication, but he has not followed up  there. He normally follows up with Dr. Sherrlyn Hock. I will consult oncology. We will also get palliative care consult to discuss goals of care with the patient and family.  3.  Chronically leukopenia. This is likely secondary to his chemotherapy and also pancreatic cancer. We will follow this closely.  4.  Chronic pain syndrome. This is likely secondary to his stage IV cancer. He is already on MS Contin and oxycodone at home. For now, we will continue IV Dilaudid for pain, also put him on some p.o. oxycodone for breakthrough pain.  5.  History of pulmonary embolism. Continue with Lovenox.   CODE STATUS: The patient is a FULL CODE.   TIME SPENT WITH THE ADMISSION: 45 minutes   ___________________________ Rolly Pancake. Cherlynn Kaiser, MD vjs:dm D: 04/28/2012 21:59:50 ET T: 04/28/2012 22:18:23 ET JOB#: [id number]  cc: Rolly Pancake. Cherlynn Kaiser, MD, <Dictator> Houston Siren MD ELECTRONICALLY SIGNED 05/03/2012 15:33

## 2014-08-10 NOTE — H&P (Signed)
PATIENT NAME:  Tyler Love, Tyler Love MR#:  [id number] DATE OF BIRTH:  12/12/1965  DATE OF ADMISSION:  05/02/2012  PRIMARY CARE PHYSICIAN: Dr. Sherrlyn Hock.   HISTORY OF PRESENT ILLNESS:  The patient is a 49 year old African-American male with past medical history significant for history of stage IV pancreatic carcinoma with liver metastasis, as well as metastatic implants in the abdominal wall and distal stomach, as well as ascending colon, who presented to the hospital with recurrent episodes of nausea and vomiting. Apparently, the patient was admitted here to the hospital for the same nausea and vomiting on  04/15/2012, discharged on 04/18/2012, also admitted on the 04/28/2012 through 04/30/2012, now comes back on the 05/02/2012, with the same symptoms of abdominal pain. Pain is described as very severe 10/10 intermittent pain in the middle of the abdomen, mostly in the upper abdomen above umbilical area, increasing whenever patient walks around; however, no significant change whenever he vomits or eats. Because of the severe abdominal pain, as well as recurrent nausea and vomiting, he decided to come back to the hospital. In the hospital, he was noted to be somewhat dehydrated in severe pain requiring IV pain medications, and the hospitalist service, as well as palliative care physician, Dr. Harvie Junior, was consulted.   PAST MEDICAL HISTORY:  Significant for history of admissions as mentioned above on January 09 through 11th for nausea, vomiting, abdominal pain. He was felt to have a possible partial small bowel obstruction, which has resolved. Was seen by Dr. Michela Pitcher at that time, who felt that patient was a very poor candidate for any kind of surgery. He is at end stage of illness and just hospice care was recommended at that time. 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   ALLERGIES:  No known drug allergies.  SOCIAL HISTORY: No smoking or alcohol abuse or illicit drug abuse. Lives at home with his wife.   FAMILY HISTORY: The patient's father died of complications of heart failure. The patient's mother is alive, has asthma. The patient's sister has breast cancer.   REVIEW OF SYSTEMS:   Positive for abdominal pain, weight loss of about 7 pounds since 2 weeks ago.  Very poor pain response to oxycodone; he has been using 5 mg every 4 hours. Weight loss as mentioned above. Nausea, vomiting, intermittent diarrhea, runny stools. Otherwise, denies fevers, chills, fatigue, weakness, weight gain.  EYES: No blurry vision, double vision, glaucoma or cataracts.  ENT: He denies any tinnitus, allergies, epistaxis, sinus pain, dentures or difficulty swallowing.  RESPIRATORY:  Denies any cough. He denies any hemoptysis, asthma or COPD. Marland Kitchen   CARDIOVASCULAR: Denies chest pains, orthopnea or edema. The patient denies palpitations or syncopal episode.   GASTROINTESTINAL:  Denies any hematemesis, occult bleeding, change in bowel habits.  GENITOURINARY: Denies dysuria, hematuria, increasing frequency or incontinence.  ENDOCRINOLOGY: Denies any polydipsia, nocturia, thyroid problems, heat or cold intolerance or thirst.  HEMATOLOGIC, LYMPHATIC: Denies any anemia, easy bruising, bleeding or swollen glands. SKIN:  Denies any acne, rashes, lesions or change in moles.   MUSCULOSKELETAL:  Denies arthritis, cramps, swelling  or gout.  NEUROLOGICAL:  Denies numbness, epilepsy or tremor. PSYCHIATRIC:  Denies anxiety, insomnia or depression.    PHYSICAL EXAMINATION: VITAL SIGNS:  On arrival to the hospital, temperature is 96.5, pulse 110, respiratory rate is 20, blood pressure 111/81, saturation 100% on room air.  GENERAL: This is a thin, very cachectic African-American male in mild distress from abdominal pain. He is lying on the stretcher.  HEENT:  His pupils are equal and reactive to light. Extraocular movements are intact.  No icterus or conjunctivitis. Has normal hearing. No pharyngeal erythema. Mucosa is dry.  NECK: No masses, supple, nontender. Thyroid is not enlarged. No adenopathy.  No JVD or carotid bruits bilaterally.  Full range of motion.  LUNGS: Clear to auscultation in all fields. No rhonchi or wheezing. No labored inspirations, increased effort, dullness to percussion or overt respiratory distress.  The patient did have some diminished breath sounds on the right and a few rales on the left at the bases.  CARDIOVASCULAR: S1, S2  present.  Rhythm is regular. Chest is nontender to palpation. Pedal pulses 1+. No lower extremity edema, calf tenderness, or cyanosis was noted.  ABDOMEN: Moderately firm in the upper abdomen. Not able to assess for hepatosplenomegaly because of significant pain the patient has been experiencing. Bowel sounds are present, but diminished. No other masses were noted and no significant tenderness in the lower abdomen, as well as the umbilical area.  MUSCULOSKELETAL:  Muscle strength 5/5 in all extremities. No cyanosis, degenerative joint disease or kyphosis. Gait was not tested. SKIN: Did not reveal any rashes, lesions, erythema, nodularity, induration. It was warm and dry to palpation. LYMPH: No adenopathy in the cervical region.  NEUROLOGICAL: Cranial nerves grossly intact. Sensory is intact. No dysarthria or aphasia. PSYCHIATRIC:  The patient is alert, oriented to time, person and place, cooperative. Memory is good. No significant confusion, agitation or depression noted.  LABORATORY AND DIAGNOSTIC  DATA:  BMP showed BUN of 19, otherwise the BMP was unremarkable. The patient's lipase level is 41, total protein is slightly elevated to 8.5, otherwise liver enzymes were unremarkable. White blood cell count is low at 1.6, hemoglobin was 14.1, platelet count 276. EKG done today, on 04/22/2012, showed normal sinus rhythm at 95 beats per minute. Normal axis. No acute ST changes were noted. The patient's heart rate now is 88.   RADIOLOGIC STUDIES:  None performed yet.  ASSESSMENT AND PLAN: 1.  Abdominal pain, likely due to pancreatic enzymes, as well as liver and peritoneal metastases, questionable small bowel obstruction. Get KUB. Continue the patient on pain medications. Advance the patient's oxycodone to 30 mg p.o. twice daily dose and continue Dilaudid.  2.  Questionable small bowel obstruction. If the patient's KUB shows obstruction, we probably will need to place NG tube in until he is more comfortable.  3.  Nausea, vomiting.  Zofran, as well as Phenergan, and as mentioned above, may need to have an NG tube placed as well.  4.  Malnutrition.  We will continue the patient on Dilaudid now, and we will get dietary involved for further recommendations.  5.  Neutropenia. Will have neutropenia precautions.  6.  Dehydration. We will continue the patient on IV fluids.   TIME SPENT:  50 minutes. ____________________________ Katharina Caperima Malin Sambrano, MD rv:dm D: 05/02/2012 17:55:00 ET T: 05/02/2012 19:45:20 ET JOB#: [id number]  cc: Darryll CapersSandeep R. Sherrlyn HockPandit, MD Katharina Caperima Idona Stach, MD, <Dictator> Dairon Procter MD ELECTRONICALLY SIGNED 06/09/2012 15:50

## 2014-08-10 NOTE — Discharge Summary (Signed)
PATIENT NAME:  Tyler Love, Tyler Love MR#:  [id number] DATE OF BIRTH:  [birth year]  DATE OF ADMISSION:  04/28/2012 DATE OF DISCHARGE:  04/30/2012  PRIMARY CARE PHYSICIAN:  Sandeep R. Sherrlyn HockPandit, MD  CONSULTANT:  Dr. Sherrlyn HockPandit from oncology, Dr. Harvie JuniorPhifer from palliative care and Dr. Michela PitcherEly from surgery.   CHIEF COMPLAINT:  Nausea, vomiting, abdominal pain.   DISCHARGE DIAGNOSES:   1.  Nausea, vomiting, abdominal pain, likely chemotherapy related also possible ileus/partial small-bowel obstruction, resolved.  2.  Stage IV pancreatic cancer with metastases.  3.  Severe malnutrition.  4.  History of pulmonary embolus.  5.  History of chronic leukopenia and neutropenia.  6.  History of migraines.   DISCHARGE MEDICATIONS:  MS Contin 15 mg per 12 hour oral tablet 1 tab every 12 hours, Lovenox 100 mg injectable solution once a day, oxycodone 5 mg every 6 hours as needed for pain, Zofran ODT 4 mg oral tab 1 tab every 4 hours as needed for nausea and vomiting, Imitrex 25 mg every 8 hours as needed for migraine, famotidine 20 mg 1 tab 2 times a day, dronabinol 2.5 mg 1 cap 2 times a day, potassium chloride 20 mEq 1 tab once a day.   DIET:  Low sodium, GI soft diet.   ACTIVITY:  As tolerated.   FOLLOWUP:  Please follow with primary care physician and oncologist within 1 to 2 weeks.   DISPOSITION:  Home.   CODE STATUS:  DNR.   HISTORY OF PRESENT ILLNESS AND HOSPITAL COURSE:  For full details of H and P, please see the dictation by Dr. Cherlynn KaiserSainani on 04/28/2012, but briefly, this is a 49 year old gentleman with pancreatic cancer status post chemotherapy palliative in nature who presents with the above chief complaint. The patient of note had just had chemotherapy on Wednesday. [facility] him to the ER further evaluation. He had a CAT scan done which showed possible early small-bowel obstruction versus ileus. The patient was admitted to the hospitalist service. The patient  initially was made n.p.o. His electrolytes were pretty much within normal limits as was as LFTs. His WBC was 2.6 and his hemoglobin was 12.5. He was passing stool and gas soon. His diet was advanced. He was seen by surgery, but not a surgical candidate. Overall, he has poor prognosis. Palliative care was consulted. He stated that he wanted to be DNR. He will follow up with his outpatient oncologist and [facility]. At this point, his pain is much improved. The CT of abdomen also picked up progressive metastases in his liver and abdomen. At this point as his symptoms have improved, he will be discharged.   TOTAL TIME SPENT:  35 minutes.    ____________________________ Krystal EatonShayiq Cassia Fein, MD sa:si D: 05/01/2012 08:06:00 ET T: 05/01/2012 22:01:21 ET JOB#: [id number]  cc: Krystal EatonShayiq Winni Ehrhard, MD, <Dictator> Krystal EatonSHAYIQ Olamide Carattini MD ELECTRONICALLY SIGNED 05/09/2012 20:32

## 2014-08-10 NOTE — Consult Note (Signed)
PATIENT NAME:  Tyler Love, WALZ MR#:  [id number] DATE OF BIRTH:  24-Jun-1965  DATE OF CONSULTATION:  04/29/2012  REFERRING PHYSICIAN:  Dr. Cherlynn Kaiser. CONSULTING PHYSICIAN:  Kirsten Mckone R. Sherrlyn Hock, MD  REASON FOR CONSULTATION:  Stage IV pancreatic cancer.   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  The patient recently also had been hospitalized at [facility] and had CT scan there which was consistent with progressive metastatic disease and he was recommended chemotherapy locally.  The patient started on cycle one treatment with FOLFOX chemotherapy regimen earlier this week.  He had 5-FU pump discontinued yesterday, at which time he had developed significant abdominal pain, nausea and vomiting, unable to keep food down.  He has been admitted to hospital and started on supportive care along with IV fluids.  He is on morphine sulfate 15 mg q. 12 hours along with Dilaudid IV p.r.n. for breakthrough pain.  He states that he is beginning to do better today, pain is coming under better control and he does not have nausea.  He is on liquid diet.  Eating minimal.  Still weak overall.  Denies diarrhea or bloody stools.  He is on Lovenox injection 100 mg daily for history of PE.   PAST MEDICAL HISTORY AND PAST SURGICAL HISTORY:   1.  Stage IV pancreatic cancer.   2.  Bilateral PE March 2013.  3.  Echocardiogram March 2013 showed severe right ventricular dysfunction. 4.  Chronic vertigo.  5.  History of gout.   FAMILY HISTORY:  Remarkable for breast cancer in a sister in her 30s.   SOCIAL HISTORY:  Denies smoking, alcohol or recreational drug usage.   ALLERGIES:  No known drug allergies.   HOME MEDICATIONS:   1.  Marinol 2.5 mg p.o. b.i.d.  2.  Imitrex p.r.n. for migraine headache. 3.  Zofran 4 mg t.i.d. p.r.n. for nausea. 4.  Oxycodone 5 mg q. 6 hours p.r.n. for pain. 5.  Lovenox  100 mg Sub-Q daily.  6.  MS Contin 15 mg q. 12 hours.   REVIEW OF SYSTEMS: CONSTITUTIONAL:  Still weak.  Otherwise, denies any fever or chills.  HEENT:  No new headaches, dizziness, epistaxis, ear, or jaw pain.  CARDIAC:  No angina, palpitation, orthopnea, or PND.  LUNGS:  No new dyspnea at rest, cough, sputum, chest pain, or hemoptysis.  GASTROINTESTINAL:  As in history of present illness.  GENITOURINARY:  No dysuria or hematuria, urine output is less.  SKIN:  No new rashes or pruritus.  HEMATOLOGIC:  Denies obvious bleeding symptoms.  MUSCULOSKELETAL:  No new bone pain.  EXTREMITIES:  No new pain or swelling.  NEUROLOGIC:  No new focal weakness, seizures or loss of consciousness.  ENDOCRINE:  No polyuria or polydipsia.  Appetite has been poor lately.   PHYSICAL EXAMINATION: GENERAL:  Patient is poorly-nourished, weak and tired-looking, resting in bed, otherwise alert and oriented and converses appropriately.  No acute distress at rest.  No icterus.  VITAL SIGNS:  98.7, 75, 18, 129/87, 98 on room air.  HEENT:  Normocephalic, atraumatic.  Extraocular movements intact.  Sclerae anicteric.  No oral thrush.  NECK:  Negative for lymphadenopathy.  CARDIOVASCULAR:  S1 and S2, regular rate and rhythm.  LUNGS:  Bilateral good air entry, decreased at bases, no rhonchi noted.  ABDOMEN:  Soft, tenderness present in the right upper quadrant.  Liver is palpable.  Bowel sounds normally heard.  No guarding or rigidity otherwise.  EXTREMITIES:  No major edema or cyanosis.  SKIN:  No generalized rashes or major bruising.  NEUROLOGIC:  Limited examination. Cranial nerves seem intact. Moves all extremities spontaneously.   LABORATORY AND RADIOLOGICAL DATA:  Hemoglobin 12.5, WBC 2600, ANC 2200, platelets 280, creatinine 0.83, potassium 3.7, calcium 9.3.  Liver functions unremarkable.  Lipase low at 39, troponin I less than 0.02.  Urinalysis mostly unremarkable.    CT scan of the abdomen and pelvis with  contrast shows a region concerning for metastatic implants in the anterior abdominal wall and other areas including gastric lumen distally involving ascending colon, possible early/partial small bowel obstruction versus reactive ileus, possible metastatic disease in the liver.   IMPRESSION AND RECOMMENDATIONS:  The patient is a 49 year old male with known history of stage IV metastatic pancreatic cancer with liver and abdominal metastases, most recently started on palliative chemotherapy with FOLFOX regimen about 3 days ago.  The patient has been admitted with vomiting and abdominal pain, symptoms seem to be improving at this time.  Continue current supportive care including IV fluids and symptomatic treatment, surgery also has been consulted.  At this time does not seem to have acute pain issues or progressive obstructive symptoms.  The patient explained that repeat CT scan continues to show evidence of progression of disease in the liver and abdominal metastasis and this is likely cause of his pain and other gastrointestinal symptoms also.  Overall prognosis is poor, patient is aware of possible response rates from chemotherapy which is quite low also.  CBC does not show significant neutropenia or thrombocytopenia, continue to monitor CBC intermittently while in hospital.  The patient otherwise is DO NOT RESUSCITATE.  No other new oncology recommendations.  Oncology will follow during hospitalization on an as-needed basis.  If patient is discharged soon, he was advised to [facility] as already scheduled.  He is agreeable to this plan.   Thank you for the referral.  Please feel free to contact me if any additional questions.     ____________________________ Maren ReamerSandeep R. Sherrlyn HockPandit, MD srp:ea D: 04/30/2012 00:05:57 ET T: 04/30/2012 02:24:24 ET JOB#: [id number]  cc: Darryll CapersSandeep R. Sherrlyn HockPandit, MD, <Dictator> Wille CelesteSANDEEP R Jasslyn Finkel MD ELECTRONICALLY SIGNED 05/02/2012 9:10

## 2014-08-10 NOTE — Consult Note (Signed)
    Comments   Followup visit made. Pt says he has been hurting all morning since abd exam earlier. Rates pain 6-7/10. Will have RN give dose of hydromorphone. Will followup later to assess effect.   Electronic Signatures for Addendum Section:  Phifer, Harriett SineNancy (MD) (Signed Addendum 15-Jan-14 16:03)  Examined pt with Elouise MunroeJosh Paylin Hailu, NP, and discussed with him in detail. Agree with assessment and plan as outlined in above note. Would increase fentanyl patch to 50mcg.   Electronic Signatures: Kaidance Pantoja, Daryl EasternJoshua R (NP)  (Signed 15-Jan-14 13:17)  Authored: Palliative Care   Last Updated: 15-Jan-14 16:03 by Phifer, Harriett SineNancy (MD)

## 2014-08-12 NOTE — Op Note (Signed)
PATIENT NAME:  Tyler Love, [name] MR#:  [id number] DATE OF BIRTH:  1965/10/10  DATE OF PROCEDURE:  08/19/2011  PREOPERATIVE DIAGNOSIS: Pancreatic cancer.   POSTOPERATIVE DIAGNOSIS: Pancreatic cancer.  PROCEDURES PERFORMED: 1. Ultrasound guidance for vascular access, right jugular vein.  2. Fluoroscopic guidance for placement of catheter.  3. Placement of a CT compatible Infuse-a-Port, right jugular vein.   SURGEON: Annice NeedyJason S. Kailie Polus, MD  ANESTHESIA: Local with moderate conscious sedation.   ESTIMATED BLOOD LOSS: 25 mL.  FLUOROSCOPY TIME: Less than one minute.   CONTRAST USED: None.   INDICATION FOR PROCEDURE: This is a 49 year old African American male with pancreatic cancer. He needs a Port-A-Cath for chemotherapy. The risks and benefits were discussed and informed consent was obtained.   DESCRIPTION OF PROCEDURE: The patient was brought to the vascular interventional radiology suite. The right neck and chest were sterilely prepped and draped and a sterile surgical field was created. The jugular vein was visualized with ultrasound and found to be widely patent. It was then accessed under direct ultrasound guidance without difficulty with a Seldinger needle and permanent image was recorded. A J-wire was placed. After skin nick and dilatation, the peel-away sheath was placed over the wire and the wire and dilator were removed. I then anesthetized an area below the right clavicle, approximately two fingerbreadths. A transverse incision was created. An inferior pocket was created. The port was then secured in the pocket with two Prolene sutures and connected to the catheter which was tunneled from the subclavicular incision to the access site. Fluoroscopic guidance was used to cut the catheter to an appropriate length. It was placed through the peel-away sheath and the peel-away sheath was removed. The catheter tip was found to be in excellent location, at the cavoatrial junction, without kink. The  wound was copiously irrigated with antibiotic impregnated saline. The subclavicular incision was closed with a 3-0 Vicryl and a 4-0 Monocryl. The access incision was closed with a single 4-0 Monocryl. The [place] needle was then used to access the port. It withdrew blood well and flushed easily with heparinized saline. A sterile dressing was then placed. The patient tolerated the procedure well and was taken to the recovery room in stable condition. ____________________________ Annice NeedyJason S. Abdalrahman Clementson, MD jsd:slb D: 08/19/2011 10:29:42 ET T: 08/19/2011 11:49:56 ET JOB#: [id number]  cc: Annice NeedyJason S. Ashleymarie Granderson, MD, <Dictator> Annice NeedyJASON S Keyah Blizard MD ELECTRONICALLY SIGNED 08/20/2011 13:56
